# Patient Record
Sex: Female | Born: 1945 | Race: White | Hispanic: No | Marital: Married | Smoking: Never smoker
Health system: Southern US, Community
[De-identification: ages and names within clinical notes are randomized; demographics above are authoritative.]

## PROBLEM LIST (undated history)

## (undated) DIAGNOSIS — F329 Major depressive disorder, single episode, unspecified: Secondary | ICD-10-CM

## (undated) DIAGNOSIS — E7800 Pure hypercholesterolemia, unspecified: Secondary | ICD-10-CM

## (undated) DIAGNOSIS — J45909 Unspecified asthma, uncomplicated: Secondary | ICD-10-CM

## (undated) DIAGNOSIS — I10 Essential (primary) hypertension: Secondary | ICD-10-CM

## (undated) DIAGNOSIS — F32A Depression, unspecified: Secondary | ICD-10-CM

## (undated) DIAGNOSIS — K219 Gastro-esophageal reflux disease without esophagitis: Secondary | ICD-10-CM

## (undated) DIAGNOSIS — F419 Anxiety disorder, unspecified: Secondary | ICD-10-CM

## (undated) DIAGNOSIS — F429 Obsessive-compulsive disorder, unspecified: Secondary | ICD-10-CM

## (undated) HISTORY — PX: TONSILLECTOMY: SUR1361

## (undated) HISTORY — PX: APPENDECTOMY: SHX54

## (undated) HISTORY — PX: CHOLECYSTECTOMY: SHX55

---

## 2011-03-12 ENCOUNTER — Emergency Department (INDEPENDENT_AMBULATORY_CARE_PROVIDER_SITE_OTHER): Payer: 59

## 2011-03-12 ENCOUNTER — Emergency Department (HOSPITAL_BASED_OUTPATIENT_CLINIC_OR_DEPARTMENT_OTHER)
Admission: EM | Admit: 2011-03-12 | Discharge: 2011-03-12 | Disposition: A | Discharge status: Home / Self Care | Payer: 59 | Attending: Emergency Medicine | Admitting: Emergency Medicine

## 2011-03-12 ENCOUNTER — Encounter: Payer: Self-pay | Admitting: Student

## 2011-03-12 DIAGNOSIS — R110 Nausea: Secondary | ICD-10-CM | POA: Insufficient documentation

## 2011-03-12 DIAGNOSIS — E7800 Pure hypercholesterolemia, unspecified: Secondary | ICD-10-CM | POA: Insufficient documentation

## 2011-03-12 DIAGNOSIS — F411 Generalized anxiety disorder: Secondary | ICD-10-CM | POA: Insufficient documentation

## 2011-03-12 DIAGNOSIS — F3289 Other specified depressive episodes: Secondary | ICD-10-CM | POA: Insufficient documentation

## 2011-03-12 DIAGNOSIS — R141 Gas pain: Secondary | ICD-10-CM

## 2011-03-12 DIAGNOSIS — R1084 Generalized abdominal pain: Secondary | ICD-10-CM

## 2011-03-12 DIAGNOSIS — F329 Major depressive disorder, single episode, unspecified: Secondary | ICD-10-CM | POA: Insufficient documentation

## 2011-03-12 DIAGNOSIS — K219 Gastro-esophageal reflux disease without esophagitis: Secondary | ICD-10-CM | POA: Insufficient documentation

## 2011-03-12 DIAGNOSIS — I10 Essential (primary) hypertension: Secondary | ICD-10-CM | POA: Insufficient documentation

## 2011-03-12 DIAGNOSIS — R109 Unspecified abdominal pain: Secondary | ICD-10-CM

## 2011-03-12 HISTORY — DX: Anxiety disorder, unspecified: F41.9

## 2011-03-12 HISTORY — DX: Pure hypercholesterolemia, unspecified: E78.00

## 2011-03-12 HISTORY — DX: Essential (primary) hypertension: I10

## 2011-03-12 HISTORY — DX: Depression, unspecified: F32.A

## 2011-03-12 HISTORY — DX: Major depressive disorder, single episode, unspecified: F32.9

## 2011-03-12 HISTORY — DX: Gastro-esophageal reflux disease without esophagitis: K21.9

## 2011-03-12 HISTORY — DX: Obsessive-compulsive disorder, unspecified: F42.9

## 2011-03-12 LAB — COMPREHENSIVE METABOLIC PANEL
ALT: 20 U/L (ref 0–35)
AST: 22 U/L (ref 0–37)
CO2: 26 mEq/L (ref 19–32)
Chloride: 100 mEq/L (ref 96–112)
Creatinine, Ser: 0.7 mg/dL (ref 0.50–1.10)
GFR calc non Af Amer: >60 mL/min (ref >60)
Total Bilirubin: 0.8 mg/dL (ref 0.3–1.2)

## 2011-03-12 LAB — DIFFERENTIAL
Basophils Absolute: 0 10*3/uL (ref 0.0–0.1)
Lymphocytes Relative: 22 % (ref 12–46)
Monocytes Absolute: 0.6 10*3/uL (ref 0.1–1.0)
Neutro Abs: 4.9 10*3/uL (ref 1.7–7.7)

## 2011-03-12 LAB — URINALYSIS, ROUTINE W REFLEX MICROSCOPIC
Bilirubin Urine: NEGATIVE
Glucose, UA: NEGATIVE mg/dL
Hgb urine dipstick: NEGATIVE
Ketones, ur: NEGATIVE mg/dL
Protein, ur: NEGATIVE mg/dL

## 2011-03-12 LAB — CBC
HCT: 38.9 % (ref 36.0–46.0)
Hemoglobin: 13.2 g/dL (ref 12.0–15.0)
RBC: 4.55 MIL/uL (ref 3.87–5.11)
RDW: 12.6 % (ref 11.5–15.5)
WBC: 7.2 10*3/uL (ref 4.0–10.5)

## 2011-03-12 MED ORDER — SUCRALFATE 1 G PO TABS: 1.0000 g | ORAL_TABLET | Freq: Four times a day (QID) | ORAL | Status: DC

## 2011-03-12 MED ORDER — ONDANSETRON HCL 4 MG/2ML IJ SOLN
4.0000 mg | Freq: Once | INTRAMUSCULAR | Status: AC
  Administered 2011-03-12: 4 mg via INTRAVENOUS
  Filled 2011-03-12: qty 2

## 2011-03-12 MED ORDER — SODIUM CHLORIDE 0.9 % IV BOLUS (SEPSIS)
1000.0000 mL | Freq: Once | INTRAVENOUS | Status: AC
  Administered 2011-03-12: 1000 mL via INTRAVENOUS

## 2011-03-12 MED ORDER — LANSOPRAZOLE 30 MG PO TBDP: 30.0000 mg | ORAL_TABLET | Freq: Every day | ORAL | Status: AC

## 2011-03-12 MED ORDER — LORAZEPAM 2 MG/ML IJ SOLN
1.0000 mg | Freq: Once | INTRAMUSCULAR | Status: AC
  Administered 2011-03-12: 1 mg via INTRAVENOUS
  Filled 2011-03-12: qty 1

## 2011-03-12 MED ORDER — LORAZEPAM 1 MG PO TABS: 1.0000 mg | ORAL_TABLET | Freq: Three times a day (TID) | ORAL | Status: AC | PRN

## 2011-03-12 MED ORDER — POTASSIUM CHLORIDE CRYS ER 20 MEQ PO TBCR
30.0000 meq | EXTENDED_RELEASE_TABLET | Freq: Once | ORAL | Status: AC
  Administered 2011-03-12: 30 meq via ORAL
  Filled 2011-03-12 (×2): qty 1

## 2011-03-12 NOTE — ED Provider Notes (Addendum)
History     Chief Complaint  Patient presents with  . Abdominal Pain   HPI Comments: Pt also wakes up in the morning with nausea.    Patient is a 65 y.o. female presenting with anxiety and cramps. The history is provided by the patient.  Anxiety This is a chronic (Pt does have a history of OCD and anxiety.) problem. Episode onset: 4 months ago. The problem occurs daily. The problem has been gradually worsening. Associated symptoms include abdominal pain. Pertinent negatives include no chest pain and no headaches. The symptoms are aggravated by nothing. The symptoms are relieved by nothing. Treatments tried: Pt has seen her psychiatrist and was put on 50 mg  zoloft.  She has not seen them recently even though her symptoms have been escalating. The patient feels like she is breaking down. However she is not suicidal or homicidal.  Abdominal Cramping The primary symptoms of the illness include abdominal pain, nausea and diarrhea. The primary symptoms of the illness do not include fever, vomiting, dysuria or vaginal discharge. Primary symptoms comment: PT thinks her anxiety could be playing a factorr although the pain is newer over the last few days.  Her appetiite and nausea issue was back in may but it resolved until this episode.   The pain came on gradually. Progression: worse in the am , better at night. The abdominal pain is generalized. The abdominal pain does not radiate. Exacerbated by: not increased with eating.  Symptoms associated with the illness do not include chills, constipation, urgency, frequency or back pain.   per the nursing notes,Pt in with c/o severe nausea starting Monday, denies V. Reports MD increased reglan script. Reports decreased po intake, reports pain in center of stomach and RLQ. Pt reports onset of anxiety each morning. Pt also reports PCP tested stool for ulcer and she was negative. Reports diarrhea starting 2 days ago   Past Medical History  Diagnosis Date  .  Obsessive compulsive disorder   . Anxiety   . Hypertension   . Depression   . Hypercholesterolemia   . GERD (gastroesophageal reflux disease)     Past Surgical History  Procedure Date  . Appendectomy   . Tonsillectomy   . Cholecystectomy     History reviewed. No pertinent family history.  History  Substance Use Topics  . Smoking status: Never Smoker   . Smokeless tobacco: Never Used  . Alcohol Use: No    OB History    Grav Para Term Preterm Abortions TAB SAB Ect Mult Living                  Review of Systems  Constitutional: Negative for fever and chills.  Cardiovascular: Negative for chest pain.  Gastrointestinal: Positive for nausea, abdominal pain and diarrhea. Negative for vomiting and constipation.  Genitourinary: Negative for dysuria, urgency, frequency and vaginal discharge.  Musculoskeletal: Negative for back pain.  Neurological: Negative for headaches.  All other systems reviewed and are negative.    Physical Exam  BP 135/94  Pulse 96  Temp(Src) 98.2 F (36.8 C) (Oral)  Resp 20  Wt 180 lb (81.647 kg)  SpO2 97%  Physical Exam  Constitutional: She appears well-developed and well-nourished. No distress.  HENT:  Head: Normocephalic and atraumatic.  Right Ear: External ear normal.  Left Ear: External ear normal.  Eyes: Conjunctivae are normal. Right eye exhibits no discharge. Left eye exhibits no discharge. No scleral icterus.  Neck: Neck supple. No tracheal deviation present.  Cardiovascular: Normal  rate, regular rhythm and intact distal pulses.   Pulmonary/Chest: Effort normal and breath sounds normal. No stridor. No respiratory distress. She has no wheezes. She has no rales.  Abdominal: Soft. Bowel sounds are normal. She exhibits no distension. There is tenderness (mild diffuse, no specific focal area). There is no rebound and no guarding.  Musculoskeletal: She exhibits no edema and no tenderness.  Neurological: She is alert. She has normal  strength. No sensory deficit. Cranial nerve deficit:  no gross defecits noted. She exhibits normal muscle tone. She displays no seizure activity. Coordination normal.  Skin: Skin is warm and dry. No rash noted.  Psychiatric: Her speech is normal. Thought content is not paranoid and not delusional.       Anxious     ED Course  Procedures Labs were all normal except as noted below.  Nursing notes, lab results, and x-ray interpretations were reviewed and considered as part of the evaluation and were factored into the medical decision making. Labs Reviewed  COMPREHENSIVE METABOLIC PANEL - Abnormal; Notable for the following:    Potassium 3.2 (*)    Glucose, Bld 122 (*)    All other components within normal limits  URINALYSIS, ROUTINE W REFLEX MICROSCOPIC - Abnormal; Notable for the following:    Leukocytes, UA SMALL (*)    All other components within normal limits  CBC  DIFFERENTIAL  LIPASE, BLOOD  URINE MICROSCOPIC-ADD ON  Hypokalemia is mild.  Will oral dose of medications here. IVs was inserted. Patient was given IV fluids anti-emetics and Ativan.  Acute abdominal series without any significant findings per the radiologist report.  MDM Patient does not have any discrete areas of tenderness. I am not suspicious at this time of bowel obstruction, diverticulitis or any other acute emergency medical condition at this time. I believe is a good chance that her symptoms may be related to her anxiety. Gastritis may be a possibility as well. Other etiologies such as irritable bowel syndrome are also a possibility.  Patient does have a psychiatrist that she is seen in the past and have encouraged her to followup with her psychiatrist to treat anxiety further. She had good response today to a dose of Ativan. I will give her a short course of Ativan to take temporarily although this would not be a good long-term treatment for her anxiety.   I doubt any other EMC precluding discharge at this  time.  Celene Kras, MD 03/12/11 1328  Celene Kras, MD 04/10/11 [phone]

## 2011-03-12 NOTE — ED Notes (Addendum)
Pt reports very little relief when eating. Pt has also decreased po intake of zoloft and gerd rx. Reports of recent life events possibly triggering anxiety - reports retiring 4 months ago.

## 2011-03-12 NOTE — ED Notes (Signed)
Pt in with c/o severe nausea starting Monday, denies V. Reports MD increased reglan script. Reports decreased po intake, reports pain in center of stomach and RLQ. Pt reports onset of anxiety each morning. Pt also reports PCP tested stool for ulcer and she was negative. Reports diarrhea starting 2 days ago. Pt is tearful and anxious in triage and reports feeling like she is falling apart. Denies SI or HI.

## 2013-01-21 DIAGNOSIS — F339 Major depressive disorder, recurrent, unspecified: Secondary | ICD-10-CM | POA: Insufficient documentation

## 2013-12-11 DIAGNOSIS — F429 Obsessive-compulsive disorder, unspecified: Secondary | ICD-10-CM | POA: Insufficient documentation

## 2014-03-09 DIAGNOSIS — IMO0002 Reserved for concepts with insufficient information to code with codable children: Secondary | ICD-10-CM | POA: Insufficient documentation

## 2014-03-09 DIAGNOSIS — M1710 Unilateral primary osteoarthritis, unspecified knee: Secondary | ICD-10-CM | POA: Insufficient documentation

## 2015-10-24 DIAGNOSIS — I498 Other specified cardiac arrhythmias: Secondary | ICD-10-CM | POA: Insufficient documentation

## 2015-10-24 DIAGNOSIS — G4733 Obstructive sleep apnea (adult) (pediatric): Secondary | ICD-10-CM | POA: Insufficient documentation

## 2015-10-29 DIAGNOSIS — R109 Unspecified abdominal pain: Secondary | ICD-10-CM | POA: Insufficient documentation

## 2015-10-29 DIAGNOSIS — R300 Dysuria: Secondary | ICD-10-CM | POA: Insufficient documentation

## 2015-10-29 DIAGNOSIS — R112 Nausea with vomiting, unspecified: Secondary | ICD-10-CM | POA: Insufficient documentation

## 2015-11-04 DIAGNOSIS — N6320 Unspecified lump in the left breast, unspecified quadrant: Secondary | ICD-10-CM | POA: Insufficient documentation

## 2017-06-02 DIAGNOSIS — M1712 Unilateral primary osteoarthritis, left knee: Secondary | ICD-10-CM | POA: Insufficient documentation

## 2017-06-03 DIAGNOSIS — K625 Hemorrhage of anus and rectum: Secondary | ICD-10-CM | POA: Insufficient documentation

## 2017-08-16 DIAGNOSIS — H903 Sensorineural hearing loss, bilateral: Secondary | ICD-10-CM | POA: Insufficient documentation

## 2017-09-13 DIAGNOSIS — N814 Uterovaginal prolapse, unspecified: Secondary | ICD-10-CM | POA: Insufficient documentation

## 2017-11-30 DIAGNOSIS — I495 Sick sinus syndrome: Secondary | ICD-10-CM | POA: Insufficient documentation

## 2017-12-20 DIAGNOSIS — Z45018 Encounter for adjustment and management of other part of cardiac pacemaker: Secondary | ICD-10-CM | POA: Insufficient documentation

## 2018-07-01 DIAGNOSIS — E785 Hyperlipidemia, unspecified: Secondary | ICD-10-CM | POA: Insufficient documentation

## 2019-02-15 ENCOUNTER — Encounter (HOSPITAL_BASED_OUTPATIENT_CLINIC_OR_DEPARTMENT_OTHER): Payer: Self-pay

## 2019-02-15 ENCOUNTER — Emergency Department (HOSPITAL_BASED_OUTPATIENT_CLINIC_OR_DEPARTMENT_OTHER): Payer: Medicare HMO

## 2019-02-15 ENCOUNTER — Inpatient Hospital Stay (HOSPITAL_BASED_OUTPATIENT_CLINIC_OR_DEPARTMENT_OTHER)
Admission: EM | Admit: 2019-02-15 | Discharge: 2019-02-17 | DRG: 074 | Disposition: A | Discharge status: Home Health | Payer: Medicare HMO | Attending: Internal Medicine | Admitting: Internal Medicine

## 2019-02-15 ENCOUNTER — Other Ambulatory Visit: Payer: Self-pay

## 2019-02-15 DIAGNOSIS — Z888 Allergy status to other drugs, medicaments and biological substances status: Secondary | ICD-10-CM

## 2019-02-15 DIAGNOSIS — W19XXXA Unspecified fall, initial encounter: Secondary | ICD-10-CM

## 2019-02-15 DIAGNOSIS — S42211A Unspecified displaced fracture of surgical neck of right humerus, initial encounter for closed fracture: Secondary | ICD-10-CM | POA: Diagnosis present

## 2019-02-15 DIAGNOSIS — F329 Major depressive disorder, single episode, unspecified: Secondary | ICD-10-CM | POA: Diagnosis present

## 2019-02-15 DIAGNOSIS — F429 Obsessive-compulsive disorder, unspecified: Secondary | ICD-10-CM | POA: Diagnosis present

## 2019-02-15 DIAGNOSIS — I480 Paroxysmal atrial fibrillation: Secondary | ICD-10-CM | POA: Diagnosis present

## 2019-02-15 DIAGNOSIS — Z5309 Procedure and treatment not carried out because of other contraindication: Secondary | ICD-10-CM | POA: Diagnosis not present

## 2019-02-15 DIAGNOSIS — Z881 Allergy status to other antibiotic agents status: Secondary | ICD-10-CM

## 2019-02-15 DIAGNOSIS — I495 Sick sinus syndrome: Secondary | ICD-10-CM | POA: Diagnosis present

## 2019-02-15 DIAGNOSIS — K219 Gastro-esophageal reflux disease without esophagitis: Secondary | ICD-10-CM | POA: Diagnosis present

## 2019-02-15 DIAGNOSIS — G4733 Obstructive sleep apnea (adult) (pediatric): Secondary | ICD-10-CM | POA: Diagnosis present

## 2019-02-15 DIAGNOSIS — G629 Polyneuropathy, unspecified: Principal | ICD-10-CM | POA: Diagnosis present

## 2019-02-15 DIAGNOSIS — Z950 Presence of cardiac pacemaker: Secondary | ICD-10-CM | POA: Diagnosis present

## 2019-02-15 DIAGNOSIS — Z20828 Contact with and (suspected) exposure to other viral communicable diseases: Secondary | ICD-10-CM | POA: Diagnosis present

## 2019-02-15 DIAGNOSIS — Z9049 Acquired absence of other specified parts of digestive tract: Secondary | ICD-10-CM

## 2019-02-15 DIAGNOSIS — R55 Syncope and collapse: Secondary | ICD-10-CM

## 2019-02-15 DIAGNOSIS — Z79899 Other long term (current) drug therapy: Secondary | ICD-10-CM

## 2019-02-15 DIAGNOSIS — J45909 Unspecified asthma, uncomplicated: Secondary | ICD-10-CM | POA: Diagnosis present

## 2019-02-15 DIAGNOSIS — I10 Essential (primary) hypertension: Secondary | ICD-10-CM | POA: Diagnosis present

## 2019-02-15 DIAGNOSIS — R52 Pain, unspecified: Secondary | ICD-10-CM

## 2019-02-15 DIAGNOSIS — S0081XA Abrasion of other part of head, initial encounter: Secondary | ICD-10-CM | POA: Diagnosis present

## 2019-02-15 DIAGNOSIS — E7800 Pure hypercholesterolemia, unspecified: Secondary | ICD-10-CM | POA: Diagnosis present

## 2019-02-15 DIAGNOSIS — I351 Nonrheumatic aortic (valve) insufficiency: Secondary | ICD-10-CM | POA: Diagnosis not present

## 2019-02-15 DIAGNOSIS — S42201A Unspecified fracture of upper end of right humerus, initial encounter for closed fracture: Secondary | ICD-10-CM

## 2019-02-15 DIAGNOSIS — R42 Dizziness and giddiness: Secondary | ICD-10-CM | POA: Diagnosis present

## 2019-02-15 DIAGNOSIS — S022XXA Fracture of nasal bones, initial encounter for closed fracture: Secondary | ICD-10-CM | POA: Diagnosis not present

## 2019-02-15 DIAGNOSIS — R296 Repeated falls: Secondary | ICD-10-CM | POA: Diagnosis present

## 2019-02-15 DIAGNOSIS — I361 Nonrheumatic tricuspid (valve) insufficiency: Secondary | ICD-10-CM | POA: Diagnosis not present

## 2019-02-15 DIAGNOSIS — F419 Anxiety disorder, unspecified: Secondary | ICD-10-CM | POA: Diagnosis present

## 2019-02-15 HISTORY — DX: Unspecified asthma, uncomplicated: J45.909

## 2019-02-15 LAB — COMPREHENSIVE METABOLIC PANEL
ALT: 41 U/L (ref 0–44)
AST: 42 U/L — ABNORMAL HIGH (ref 15–41)
Albumin: 3.6 g/dL (ref 3.5–5.0)
Alkaline Phosphatase: 63 U/L (ref 38–126)
Anion gap: 12 (ref 5–15)
BUN: 10 mg/dL (ref 8–23)
CO2: 22 mmol/L (ref 22–32)
Calcium: 8.4 mg/dL — ABNORMAL LOW (ref 8.9–10.3)
Chloride: 103 mmol/L (ref 98–111)
Creatinine, Ser: 0.92 mg/dL (ref 0.44–1.00)
GFR calc Af Amer: >60 mL/min (ref >60)
GFR calc non Af Amer: >60 mL/min (ref >60)
Glucose, Bld: 129 mg/dL — ABNORMAL HIGH (ref 70–99)
Potassium: 3.5 mmol/L (ref 3.5–5.1)
Sodium: 137 mmol/L (ref 135–145)
Total Bilirubin: 0.9 mg/dL (ref 0.3–1.2)
Total Protein: 6.7 g/dL (ref 6.5–8.1)

## 2019-02-15 LAB — CBC
HCT: 36.2 % (ref 36.0–46.0)
Hemoglobin: 12 g/dL (ref 12.0–15.0)
MCH: 30.2 pg (ref 26.0–34.0)
MCHC: 33.1 g/dL (ref 30.0–36.0)
MCV: 91 fL (ref 80.0–100.0)
Platelets: 232 10*3/uL (ref 150–400)
RBC: 3.98 MIL/uL (ref 3.87–5.11)
RDW: 12.3 % (ref 11.5–15.5)
WBC: 7.7 10*3/uL (ref 4.0–10.5)
nRBC: 0 % (ref 0.0–0.2)

## 2019-02-15 LAB — CK: Total CK: 43 U/L (ref 38–234)

## 2019-02-15 LAB — TROPONIN I (HIGH SENSITIVITY)
Troponin I (High Sensitivity): <2 ng/L (ref <18)
Troponin I (High Sensitivity): <2 ng/L (ref <18)

## 2019-02-15 LAB — SARS CORONAVIRUS 2 AG (30 MIN TAT): SARS Coronavirus 2 Ag: NEGATIVE

## 2019-02-15 MED ORDER — ONDANSETRON HCL 4 MG/2ML IJ SOLN: 4.0000 mg | Freq: Four times a day (QID) | INTRAMUSCULAR | Status: DC | PRN

## 2019-02-15 MED ORDER — HYDROMORPHONE HCL 1 MG/ML IJ SOLN
1.0000 mg | Freq: Once | INTRAMUSCULAR | Status: AC
  Administered 2019-02-15: 1 mg via INTRAVENOUS
  Filled 2019-02-15: qty 1

## 2019-02-15 MED ORDER — ACETAMINOPHEN 650 MG RE SUPP: 650.0000 mg | Freq: Four times a day (QID) | RECTAL | Status: DC | PRN

## 2019-02-15 MED ORDER — ONDANSETRON HCL 4 MG PO TABS
4.0000 mg | ORAL_TABLET | Freq: Four times a day (QID) | ORAL | Status: DC | PRN
  Administered 2019-02-16: 4 mg via ORAL
  Filled 2019-02-15: qty 1

## 2019-02-15 MED ORDER — HYDROMORPHONE HCL 1 MG/ML IJ SOLN
0.5000 mg | Freq: Once | INTRAMUSCULAR | Status: AC
  Administered 2019-02-15: 0.5 mg via INTRAVENOUS
  Filled 2019-02-15: qty 1

## 2019-02-15 MED ORDER — SODIUM CHLORIDE 0.9 % IV BOLUS
500.0000 mL | Freq: Once | INTRAVENOUS | Status: AC
  Administered 2019-02-15: 500 mL via INTRAVENOUS

## 2019-02-15 MED ORDER — BUSPIRONE HCL 5 MG PO TABS
15.0000 mg | ORAL_TABLET | Freq: Three times a day (TID) | ORAL | Status: DC
  Administered 2019-02-15 – 2019-02-17 (×5): 15 mg via ORAL
  Filled 2019-02-15 (×5): qty 3

## 2019-02-15 MED ORDER — ACETAMINOPHEN 325 MG PO TABS
650.0000 mg | ORAL_TABLET | Freq: Four times a day (QID) | ORAL | Status: DC | PRN
  Administered 2019-02-16 – 2019-02-17 (×5): 650 mg via ORAL
  Filled 2019-02-15 (×5): qty 2

## 2019-02-15 MED ORDER — ALPRAZOLAM 0.5 MG PO TABS
0.5000 mg | ORAL_TABLET | Freq: Three times a day (TID) | ORAL | Status: DC
  Administered 2019-02-15 – 2019-02-17 (×5): 0.5 mg via ORAL
  Filled 2019-02-15 (×5): qty 1

## 2019-02-15 MED ORDER — METOPROLOL SUCCINATE ER 50 MG PO TB24
50.0000 mg | ORAL_TABLET | Freq: Every day | ORAL | Status: DC
  Administered 2019-02-16 – 2019-02-17 (×2): 50 mg via ORAL
  Filled 2019-02-15 (×2): qty 1

## 2019-02-15 MED ORDER — MORPHINE SULFATE (PF) 2 MG/ML IV SOLN
0.5000 mg | Freq: Once | INTRAVENOUS | Status: AC
  Administered 2019-02-15: 0.5 mg via INTRAVENOUS
  Filled 2019-02-15: qty 1

## 2019-02-15 MED ORDER — ENOXAPARIN SODIUM 40 MG/0.4ML ~~LOC~~ SOLN: 40.0000 mg | SUBCUTANEOUS | Status: DC

## 2019-02-15 MED ORDER — ROSUVASTATIN CALCIUM 10 MG PO TABS
10.0000 mg | ORAL_TABLET | Freq: Every day | ORAL | Status: DC
  Administered 2019-02-15 – 2019-02-17 (×3): 10 mg via ORAL
  Filled 2019-02-15 (×3): qty 1

## 2019-02-15 NOTE — H&P (Signed)
History and Physical    Kayla Beard YNW:[id number] DOB: 01/12/1946 DOA: 02/15/2019  PCP: Forrest Moron, MD  Patient coming from: Home.  Chief Complaint: Loss of consciousness.  HPI: Kayla Beard is a 73 y.o. 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  Patient states over the last few months patient has been having falls due to her legs giving way.  Had followed up with neurologist who feels her symptoms are due to neuropathy.  Today when patient was walking on the driveway to the road her legs gave way and fell onto the floor and hit her face.  Lost consciousness.  Prior to the fall patient did feel dizziness.  Denies any chest pain or shortness of breath.  No nausea vomiting or diarrhea.  ED Course: In the ER patient had CT head maxillofacial and C-spine which shows nasal bone fracture with small hematoma.  Patient also hurt her right shoulder which shows right humerus fracture.  ER physician discussed with on-call orthopedic surgeon Dr. Linna Caprice who advised sling for the right humerus fracture and follow-up as outpatient.  EKG shows paced rhythm.  Patient admitted for further observation.  Review of Systems: As per HPI, rest all negative.   Past Medical History:  Diagnosis Date   Anxiety    Asthma    Depression    GERD (gastroesophageal reflux disease)    Hypercholesterolemia    Hypertension    Obsessive compulsive disorder     Past Surgical History:  Procedure Laterality Date   APPENDECTOMY     CHOLECYSTECTOMY     TONSILLECTOMY       reports that she has never smoked. She has never used smokeless tobacco. She reports that she does not drink alcohol or use drugs.  Allergies  Allergen Reactions   Hydroxyzine     Other reaction(s): Other (See Comments) Sleepiness Nightmares    Prednisone Anxiety    Other reaction(s): Other (See  Comments)   Rivaroxaban Other (See Comments)    Bleeding Bleeding    Doxepin    Levofloxacin Diarrhea   Metformin And Related Diarrhea    Family History  Family history unknown: Yes    Prior to Admission medications   Medication Sig Start Date End Date Taking? Authorizing Provider  ALPRAZolam Prudy Feeler) 1 MG tablet Take 0.5 mg by mouth 3 (three) times daily. 01/29/19  Yes [provider]  busPIRone (BUSPAR) 15 MG tablet Take 15 mg by mouth 3 (three) times daily. 12/02/18  Yes [provider]  metoprolol (TOPROL-XL) 50 MG 24 hr tablet Take 50 mg by mouth daily.     Yes [provider]  rosuvastatin (CRESTOR) 10 MG tablet Take 10 mg by mouth daily. 11/19/18  Yes [provider]  lansoprazole (PREVACID SOLUTAB) 30 MG disintegrating tablet Take 1 tablet (30 mg total) by mouth daily. 03/12/11 03/11/12  Linwood Dibbles, MD  sucralfate (CARAFATE) 1 G tablet Take 1 tablet (1 g total) by mouth 4 (four) times daily. 03/12/11 03/11/12  Linwood Dibbles, MD    Physical Exam:  Constitutional: Moderately built and nourished. Vitals:   02/15/19 1600 02/15/19 1624 02/15/19 1630 02/15/19 1738  BP: 107/63 (!) 101/52 105/60 102/82  Pulse: 65 72 74 77  Resp: Temp:    98.6 F (37 C)  TempSrc:    Oral  SpO2: 93% 95% 92% 99%  Weight:  Height:       Eyes: Anicteric no pallor. ENMT: Small hematoma on the nasal bridge. Neck: No no JVD no mass felt. Respiratory: No rhonchi or crepitations. Cardiovascular: S1-S2 heard. Abdomen: Soft nontender bowel sounds present. Musculoskeletal: Pain on the right upper extremity. Skin: No rash. Neurologic: Alert awake oriented to time place and person.  Moves all extremities. Psychiatric: Appears normal.   Labs on Admission: I have personally reviewed following labs and imaging studies  CBC: Recent Labs  Lab 02/15/19 1251  WBC 7.7  HGB 12.0  HCT 36.2  MCV 91.0  PLT 232   Basic Metabolic Panel: Recent Labs  Lab  02/15/19 1251  NA 137  K 3.5  CL 103  CO2 22  GLUCOSE 129*  BUN 10  CREATININE 0.92  CALCIUM 8.4*   GFR: Estimated Creatinine Clearance: 50.6 mL/min (by C-G formula based on SCr of 0.92 mg/dL). Liver Function Tests: Recent Labs  Lab 02/15/19 1251  AST 42*  ALT 41  ALKPHOS 63  BILITOT 0.9  PROT 6.7  ALBUMIN 3.6   No results for input(s): LIPASE, AMYLASE in the last 168 hours. No results for input(s): AMMONIA in the last 168 hours. Coagulation Profile: No results for input(s): INR, PROTIME in the last 168 hours. Cardiac Enzymes: No results for input(s): CKTOTAL, CKMB, CKMBINDEX, TROPONINI in the last 168 hours. BNP (last 3 results) No results for input(s): PROBNP in the last 8760 hours. HbA1C: No results for input(s): HGBA1C in the last 72 hours. CBG: No results for input(s): GLUCAP in the last 168 hours. Lipid Profile: No results for input(s): CHOL, HDL, LDLCALC, TRIG, CHOLHDL, LDLDIRECT in the last 72 hours. Thyroid Function Tests: No results for input(s): TSH, T4TOTAL, FREET4, T3FREE, THYROIDAB in the last 72 hours. Anemia Panel: No results for input(s): VITAMINB12, FOLATE, FERRITIN, TIBC, IRON, RETICCTPCT in the last 72 hours. Urine analysis:    Component Value Date/Time   COLORURINE YELLOW 03/12/2011 1146   APPEARANCEUR CLEAR 03/12/2011 1146   LABSPEC 1.010 03/12/2011 1146   PHURINE 5.5 03/12/2011 1146   GLUCOSEU NEGATIVE 03/12/2011 1146   HGBUR NEGATIVE 03/12/2011 1146   BILIRUBINUR NEGATIVE 03/12/2011 1146   KETONESUR NEGATIVE 03/12/2011 1146   PROTEINUR NEGATIVE 03/12/2011 1146   UROBILINOGEN 0.2 03/12/2011 1146   NITRITE NEGATIVE 03/12/2011 1146   LEUKOCYTESUR SMALL (A) 03/12/2011 1146   Sepsis Labs: @LABRCNTIP (procalcitonin:4,lacticidven:4) ) Recent Results (from the past 240 hour(s))  SARS Coronavirus 2 (Hosp order,Performed [facility] lab via [employer] ID)     Status: None   Collection Time: 02/15/19  1:37 PM   Specimen: Dry Nasal Swab  ([employer] ID Now)  Result Value Ref Range Status   SARS Coronavirus 2 ([employer] ID Now) NEGATIVE NEGATIVE Final    Comment: (NOTE) SARS-CoV-2 target nucleic acids are NOT DETECTED. The SARS-CoV-2 RNA is generally detectable in upper and lower respiratory specimens during the acute phase of infection.  Negativeresults do not preclude SARS-CoV-2 infection, do not rule out coinfections with other pathogens, and should not be used as the  sole basis for treatment or other patient management decisions.  Negative results must be combined with clinical observations, patient history, and epidemiological information. The expected result is Negative. Fact Sheet for Patients: http://www.graves-[place].org/https://www.fda.gov/media/136524/download Fact Sheet for Healthcare Providers: En[employer]roConcern.sihttps://www.fda.gov/media/136523/download This test is not yet approved or cleared by the [place] FDA and  has been authorized for detection and/or diagnosis of SARS-CoV-2 by FDA under an Emergency Use Authorization (EUA).  This EUA will remain in effect (meaning  this test can be used) for the duration of  the COVID19 declaration under Section 5 64(b)(1) of the Act, 21 U.S.C.  section [phone] 3(b)(1), unless the authorization is terminated or revoked sooner. Performed at [facility], [address] [place], [place] [address]      Radiological Exams on Admission: Dg Chest 1 View  Result Date: 02/15/2019 CLINICAL DATA:  Fall. EXAM: CHEST  1 VIEW COMPARISON:  Radiographs of June 13, 2018. FINDINGS: The heart size and mediastinal contours are within normal limits. No pneumothorax or pleural effusion is noted. Left-sided pacemaker is unchanged in position. Both lungs are clear. Mildly displaced proximal right humeral neck fracture is noted. IMPRESSION: No acute cardiopulmonary abnormality seen. Mildly displaced proximal right humeral neck fracture. Electronically Signed   By: Marijo Conception M.D.   On: 02/15/2019 13:55   Dg Shoulder  Right  Result Date: 02/15/2019 CLINICAL DATA:  Right shoulder pain after fall today. EXAM: RIGHT SHOULDER - 2+ VIEW COMPARISON:  None. FINDINGS: Mildly displaced proximal right humeral neck fracture is noted. Severe degenerative changes seen involving the right glenohumeral joint. Visualized ribs are unremarkable. IMPRESSION: Mildly displaced proximal right humeral neck fracture. Electronically Signed   By: Marijo Conception M.D.   On: 02/15/2019 13:56   Ct Head Wo Contrast  Result Date: 02/15/2019 CLINICAL DATA:  Status post fall today and landed on the face. Regions to the forehead, nose, lips EXAM: CT HEAD WITHOUT CONTRAST CT MAXILLOFACIAL WITHOUT CONTRAST CT CERVICAL SPINE WITHOUT CONTRAST TECHNIQUE: Multidetector CT imaging of the head, cervical spine, and maxillofacial structures were performed using the standard protocol without intravenous contrast. Multiplanar CT image reconstructions of the cervical spine and maxillofacial structures were also generated. COMPARISON:  None. FINDINGS: CT HEAD FINDINGS Brain: No evidence of acute infarction, hemorrhage, extra-axial collection, ventriculomegaly, or mass effect. Generalized cerebral atrophy. Periventricular white matter low attenuation likely secondary to microangiopathy. Vascular: Cerebrovascular atherosclerotic calcifications are noted. Skull: Negative for fracture or focal lesion. Sinuses/Orbits: Visualized portions of the orbits are unremarkable. Visualized portions of the paranasal sinuses and mastoid air cells are unremarkable. Other: None. CT MAXILLOFACIAL FINDINGS Osseous: No mandibular fracture or dislocation. Mildly comminuted right nasal bone fracture. Nondisplaced left nasal bone fracture. Overlying soft tissue swelling. Temporomandibular joints are normal. No aggressive osseous lesion. Orbits: Negative. No traumatic or inflammatory finding. Sinuses: Paranasal sinuses are clear. Mastoid sinuses are clear. Rightward deviation of the nasal septum.  Soft tissues: No fluid collection or hematoma. Soft tissue swelling over the nose. CT CERVICAL SPINE FINDINGS Alignment: Normal. Skull base and vertebrae: No acute fracture. No primary bone lesion or focal pathologic process. Soft tissues and spinal canal: No prevertebral fluid or swelling. No visible canal hematoma. Disc levels: Degenerative disease with disc height loss at C6-7 and C7-T1. Right uncovertebral degenerative changes at C6-7 and C7-T1 with foraminal stenosis. Bilateral facet arthropathy at C7-T1. Upper chest: Lung apices are clear. Other: No fluid collection or hematoma. IMPRESSION: 1. No acute intracranial pathology. 2. Mildly comminuted right nasal bone fracture. Nondisplaced left nasal bone fracture. Overlying soft tissue swelling. 3.  No acute osseous injury of the cervical spine. Electronically Signed   By: Kathreen Devoid   On: 02/15/2019 13:44   Ct Cervical Spine Wo Contrast  Result Date: 02/15/2019 CLINICAL DATA:  Status post fall today and landed on the face. Regions to the forehead, nose, lips EXAM: CT HEAD WITHOUT CONTRAST CT MAXILLOFACIAL WITHOUT CONTRAST CT CERVICAL SPINE WITHOUT CONTRAST TECHNIQUE: Multidetector CT imaging of  the head, cervical spine, and maxillofacial structures were performed using the standard protocol without intravenous contrast. Multiplanar CT image reconstructions of the cervical spine and maxillofacial structures were also generated. COMPARISON:  None. FINDINGS: CT HEAD FINDINGS Brain: No evidence of acute infarction, hemorrhage, extra-axial collection, ventriculomegaly, or mass effect. Generalized cerebral atrophy. Periventricular white matter low attenuation likely secondary to microangiopathy. Vascular: Cerebrovascular atherosclerotic calcifications are noted. Skull: Negative for fracture or focal lesion. Sinuses/Orbits: Visualized portions of the orbits are unremarkable. Visualized portions of the paranasal sinuses and mastoid air cells are unremarkable.  Other: None. CT MAXILLOFACIAL FINDINGS Osseous: No mandibular fracture or dislocation. Mildly comminuted right nasal bone fracture. Nondisplaced left nasal bone fracture. Overlying soft tissue swelling. Temporomandibular joints are normal. No aggressive osseous lesion. Orbits: Negative. No traumatic or inflammatory finding. Sinuses: Paranasal sinuses are clear. Mastoid sinuses are clear. Rightward deviation of the nasal septum. Soft tissues: No fluid collection or hematoma. Soft tissue swelling over the nose. CT CERVICAL SPINE FINDINGS Alignment: Normal. Skull base and vertebrae: No acute fracture. No primary bone lesion or focal pathologic process. Soft tissues and spinal canal: No prevertebral fluid or swelling. No visible canal hematoma. Disc levels: Degenerative disease with disc height loss at C6-7 and C7-T1. Right uncovertebral degenerative changes at C6-7 and C7-T1 with foraminal stenosis. Bilateral facet arthropathy at C7-T1. Upper chest: Lung apices are clear. Other: No fluid collection or hematoma. IMPRESSION: 1. No acute intracranial pathology. 2. Mildly comminuted right nasal bone fracture. Nondisplaced left nasal bone fracture. Overlying soft tissue swelling. 3.  No acute osseous injury of the cervical spine. Electronically Signed   By: Elige KoHetal  Patel   On: 02/15/2019 13:44   Ct Maxillofacial Wo Contrast  Result Date: 02/15/2019 CLINICAL DATA:  Status post fall today and landed on the face. Regions to the forehead, nose, lips EXAM: CT HEAD WITHOUT CONTRAST CT MAXILLOFACIAL WITHOUT CONTRAST CT CERVICAL SPINE WITHOUT CONTRAST TECHNIQUE: Multidetector CT imaging of the head, cervical spine, and maxillofacial structures were performed using the standard protocol without intravenous contrast. Multiplanar CT image reconstructions of the cervical spine and maxillofacial structures were also generated. COMPARISON:  None. FINDINGS: CT HEAD FINDINGS Brain: No evidence of acute infarction, hemorrhage, extra-axial  collection, ventriculomegaly, or mass effect. Generalized cerebral atrophy. Periventricular white matter low attenuation likely secondary to microangiopathy. Vascular: Cerebrovascular atherosclerotic calcifications are noted. Skull: Negative for fracture or focal lesion. Sinuses/Orbits: Visualized portions of the orbits are unremarkable. Visualized portions of the paranasal sinuses and mastoid air cells are unremarkable. Other: None. CT MAXILLOFACIAL FINDINGS Osseous: No mandibular fracture or dislocation. Mildly comminuted right nasal bone fracture. Nondisplaced left nasal bone fracture. Overlying soft tissue swelling. Temporomandibular joints are normal. No aggressive osseous lesion. Orbits: Negative. No traumatic or inflammatory finding. Sinuses: Paranasal sinuses are clear. Mastoid sinuses are clear. Rightward deviation of the nasal septum. Soft tissues: No fluid collection or hematoma. Soft tissue swelling over the nose. CT CERVICAL SPINE FINDINGS Alignment: Normal. Skull base and vertebrae: No acute fracture. No primary bone lesion or focal pathologic process. Soft tissues and spinal canal: No prevertebral fluid or swelling. No visible canal hematoma. Disc levels: Degenerative disease with disc height loss at C6-7 and C7-T1. Right uncovertebral degenerative changes at C6-7 and C7-T1 with foraminal stenosis. Bilateral facet arthropathy at C7-T1. Upper chest: Lung apices are clear. Other: No fluid collection or hematoma. IMPRESSION: 1. No acute intracranial pathology. 2. Mildly comminuted right nasal bone fracture. Nondisplaced left nasal bone fracture. Overlying soft tissue swelling. 3.  No acute osseous injury  of the cervical spine. Electronically Signed   By: Elige KoHetal  Patel   On: 02/15/2019 13:44    EKG: Independently reviewed.  Paced rhythm.  Assessment/Plan Principal Problem:   Syncope Active Problems:   Fall   Closed fracture of right proximal humerus   PAF (paroxysmal atrial fibrillation) (HCC)    Pacemaker   Closed fracture of nasal bones    1. Syncope -patient had mild dizziness prior to the fall.  Has been having recurrent falls previously and neurologist attributes to neuropathy as per the patient.  Given the pacemaker with a history of tachybradycardia syndrome I have consulted cardiology.  Check orthostatics. 2. Right shoulder fracture and nasal bone fracture -follow with Dr. Linna CapriceSwinteck orthopedic surgeon. 3. History of paroxysmal atrial fibrillation not on anticoagulation secondary to GI bleed as per the patient.  On metoprolol. 4. History of anxiety on BuSpar.   DVT prophylaxis: SCDs. Code Status: Full code. Family Communication: Discussed with patient. Disposition Plan: Home. Consults called: Cardiology. Admission status: Observation.   Eduard ClosArshad N Henya Aguallo MD [facility] Pager [phone].  If 7PM-7AM, please contact night-coverage [email or web address] Password [email or web address]  02/15/2019, 8:09 PM

## 2019-02-15 NOTE — ED Notes (Signed)
Purewick applied to patient- pt aware of the need for urine sample

## 2019-02-15 NOTE — ED Notes (Signed)
Spoke with pt husband and informed of plan of care- pt on phone with husband now

## 2019-02-15 NOTE — ED Notes (Signed)
[employer] called and will come interrogate pacemaker STAT

## 2019-02-15 NOTE — ED Notes (Signed)
Facial/nasal swelling, bruising, abrasions to nose lip.  S/p fall

## 2019-02-15 NOTE — ED Notes (Signed)
Per [employer] rep- pacemaker functioning with no abnormalities.

## 2019-02-15 NOTE — ED Notes (Addendum)
Patients pmd sent over information, given to Dr Sedonia Small, MD at bedside

## 2019-02-15 NOTE — ED Notes (Signed)
[facility] arrived to transport pt to [facility] 

## 2019-02-15 NOTE — ED Triage Notes (Signed)
Pt states fell today, face first, complains right arm pain, facial pain, abrasions nose, forehead. States has been having frequent falls, dizziness. Pt awake alert oriented , answering all questions appropriately. Does not take blood thinner.

## 2019-02-15 NOTE — ED Notes (Signed)
Patient transported to CT 

## 2019-02-15 NOTE — ED Notes (Signed)
[employer] rep at bedside

## 2019-02-15 NOTE — Progress Notes (Addendum)
Patient arrived from [facility].  PIV to left AC WNL.  Patient placed on telemetry.  Patient alert and oriented.  Yellow socks and fall risk arm band placed on patient.  Patient educated on call bell use - verbalized understanding.    Admitting notified of patient's arrival to unit.

## 2019-02-15 NOTE — Progress Notes (Signed)
RN notified by patient placement that Dr. Roel Cluck will be placing admission orders.

## 2019-02-15 NOTE — ED Provider Notes (Addendum)
[facility] Emergency Department Provider Note MRN:  [id number]  Arrival date & time: 02/15/19     Chief Complaint   Fall and Dizziness   History of Present Illness   Kayla Beard is a 73 y.o. year-old female with a history of hypertension, A. fib, pacemaker presenting to the ED with chief complaint of fall, dizziness.  Patient explains that she fell today and this is her third time falling in the past month.  Usually she attributes it to her legs becoming weak and giving out.  Today, she was walking to the end of her driveway, and when she began walking back to her house she became lightheaded and then possibly passed out.  Struck the front of her face against the ground.  Unwitnessed.  Endorsing severe right shoulder pain that is worse with motion or palpation.  Denies chest pain or shortness of breath, no abdominal pain, no back pain, no numbness or weakness to the arms or legs.  Review of Systems  A complete 10 system review of systems was obtained and all systems are negative except as noted in the HPI and PMH.   Patient's Health History    Past Medical History:  Diagnosis Date  . Anxiety   . Depression   . GERD (gastroesophageal reflux disease)   . Hypercholesterolemia   . Hypertension   . Obsessive compulsive disorder     Past Surgical History:  Procedure Laterality Date  . APPENDECTOMY    . CHOLECYSTECTOMY    . TONSILLECTOMY      History reviewed. No pertinent family history.  Social History   Socioeconomic History  . Marital status: Married    Spouse name: Not on file  . Number of children: Not on file  . Years of education: Not on file  . Highest education level: Not on file  Occupational History  . Not on file  Social Needs  . Financial resource strain: Not on file  . Food insecurity    Worry: Not on file    Inability: Not on file  . Transportation needs    Medical: Not on file    Non-medical: Not on file  Tobacco Use  .  Smoking status: Never Smoker  . Smokeless tobacco: Never Used  Substance and Sexual Activity  . Alcohol use: No  . Drug use: No  . Sexual activity: Not on file  Lifestyle  . Physical activity    Days per week: Not on file    Minutes per session: Not on file  . Stress: Not on file  Relationships  . Social Musicianconnections    Talks on phone: Not on file    Gets together: Not on file    Attends religious service: Not on file    Active member of club or organization: Not on file    Attends meetings of clubs or organizations: Not on file    Relationship status: Not on file  . Intimate partner violence    Fear of current or ex partner: Not on file    Emotionally abused: Not on file    Physically abused: Not on file    Forced sexual activity: Not on file  Other Topics Concern  . Not on file  Social History Narrative  . Not on file     Physical Exam  Vital Signs and Nursing Notes reviewed Vitals:   02/15/19 1400 02/15/19 1415  BP: 102/62   Pulse: 71 70  Resp: (!) 9 15  Temp:  SpO2: 92% 92%    CONSTITUTIONAL: Chronically ill-appearing, NAD NEURO:  Alert and oriented x 3, no focal deficits EYES:  eyes equal and reactive, normal extraocular movements ENT/NECK:  no LAD, no JVD; bruising to the nose, no septal hematoma CARDIO: Regular rate, well-perfused, normal S1 and S2 PULM:  CTAB no wheezing or rhonchi GI/GU:  normal bowel sounds, non-distended, non-tender MSK/SPINE:  No gross deformities, no edema; significant tenderness palpation of the right shoulder, neurovascularly intact distally SKIN:  no rash, atraumatic PSYCH:  Appropriate speech and behavior  Diagnostic and Interventional Summary    EKG Interpretation  Date/Time:  Wednesday February 15 2019 12:15:10 EDT Ventricular Rate:  91 PR Interval:    QRS Duration: 81 QT Interval:  350 QTC Calculation: 426 R Axis:   8 Text Interpretation:  Atrial-paced complexes Low voltage, precordial leads Borderline abnrm T,  anterolateral leads Confirmed by Kennis CarinaBero, Yanai Hobson [phone]([id number]) on 02/15/2019 12:45:59 PM      Labs Reviewed  COMPREHENSIVE METABOLIC PANEL - Abnormal; Notable for the following components:      Result Value   Glucose, Bld 129 (*)    Calcium 8.4 (*)    AST 42 (*)    All other components within normal limits  SARS CORONAVIRUS 2 (HOSP ORDER, PERFORMED IN [place] [facility] VIA [employer] ID)  CBC  URINALYSIS, ROUTINE W REFLEX MICROSCOPIC    DG Shoulder Right  Final Result    DG Chest 1 View  Final Result    CT HEAD WO CONTRAST  Final Result    CT CERVICAL SPINE WO CONTRAST  Final Result    CT MAXILLOFACIAL WO CONTRAST  Final Result      Medications  sodium chloride 0.9 % bolus 500 mL (0 mLs Intravenous Stopped 02/15/19 1445)  HYDROmorphone (DILAUDID) injection 1 mg (1 mg Intravenous Given 02/15/19 1302)     Procedures Critical Care  ED Course and Medical Decision Making  I have reviewed the triage vital signs and the nursing notes.  Pertinent labs & imaging results that were available during my care of the patient were reviewed by me and considered in my medical decision making (see below for details).  Syncopal episode and ground-level fall in this 73 year old female with pacemaker, hypertension, obesity.  Patient denies chest pain before or after the syncopal event, EKG is without ischemic changes.  Will interrogate the pacer, obtain labs to evaluate for metabolic disarray, UA to rule out UTI, x-ray and CT imaging to exclude significant traumatic injuries.  Work-up reveals nasal bone fracture, proximal humerus fracture.  Awaiting callback from orthopedics for recommendations regarding humerus fracture.  Admitted to hospital service for further care of high risk syncope.  Pacemaker was interrogated and there were no events.  3:38 PM update: Spoke with Dr. Joanna PuffSprintec of orthopedics, who recommends sling and follow-up with Dr. Aundria Rudogers in 1 week.  Elmer SowMichael M. Pilar PlateBero, MD [facility]  [facility] [email or web address]  Final Clinical Impressions(s) / ED Diagnoses     ICD-10-CM   1. Syncope, unspecified syncope type  R55   2. Fall  W19.Lorne [id number] DG Chest 1 View    DG Chest 1 View    CANCELED: DG Chest 2 View    CANCELED: DG Chest 2 View  3. Closed fracture of proximal end of right humerus, unspecified fracture morphology, initial encounter  S42.201A   4. Closed fracture of nasal bone, initial encounter  S02.2XXA     ED Discharge Orders    None  Maudie Flakes, MD 02/15/19 1521    Maudie Flakes, MD 02/15/19 1539

## 2019-02-15 NOTE — ED Notes (Addendum)
Berneta Sages (Husband) Cell Phone- [phone]

## 2019-02-16 ENCOUNTER — Inpatient Hospital Stay (HOSPITAL_COMMUNITY): Payer: Medicare HMO

## 2019-02-16 DIAGNOSIS — Z5309 Procedure and treatment not carried out because of other contraindication: Secondary | ICD-10-CM

## 2019-02-16 DIAGNOSIS — I351 Nonrheumatic aortic (valve) insufficiency: Secondary | ICD-10-CM

## 2019-02-16 DIAGNOSIS — W19XXXA Unspecified fall, initial encounter: Secondary | ICD-10-CM

## 2019-02-16 DIAGNOSIS — I480 Paroxysmal atrial fibrillation: Secondary | ICD-10-CM

## 2019-02-16 DIAGNOSIS — Z950 Presence of cardiac pacemaker: Secondary | ICD-10-CM

## 2019-02-16 DIAGNOSIS — R55 Syncope and collapse: Secondary | ICD-10-CM

## 2019-02-16 DIAGNOSIS — I361 Nonrheumatic tricuspid (valve) insufficiency: Secondary | ICD-10-CM

## 2019-02-16 LAB — URINALYSIS, ROUTINE W REFLEX MICROSCOPIC
Bilirubin Urine: NEGATIVE
Glucose, UA: NEGATIVE mg/dL
Hgb urine dipstick: NEGATIVE
Ketones, ur: 5 mg/dL — AB
Nitrite: NEGATIVE
Protein, ur: NEGATIVE mg/dL
Specific Gravity, Urine: 1.027 (ref 1.005–1.030)
pH: 5 (ref 5.0–8.0)

## 2019-02-16 LAB — BASIC METABOLIC PANEL
Anion gap: 10 (ref 5–15)
BUN: 12 mg/dL (ref 8–23)
CO2: 22 mmol/L (ref 22–32)
Calcium: 8.1 mg/dL — ABNORMAL LOW (ref 8.9–10.3)
Chloride: 104 mmol/L (ref 98–111)
Creatinine, Ser: 0.69 mg/dL (ref 0.44–1.00)
GFR calc Af Amer: >60 mL/min (ref >60)
GFR calc non Af Amer: >60 mL/min (ref >60)
Glucose, Bld: 128 mg/dL — ABNORMAL HIGH (ref 70–99)
Potassium: 3.7 mmol/L (ref 3.5–5.1)
Sodium: 136 mmol/L (ref 135–145)

## 2019-02-16 LAB — CBC
HCT: 37.1 % (ref 36.0–46.0)
Hemoglobin: 11.9 g/dL — ABNORMAL LOW (ref 12.0–15.0)
MCH: 30.5 pg (ref 26.0–34.0)
MCHC: 32.1 g/dL (ref 30.0–36.0)
MCV: 95.1 fL (ref 80.0–100.0)
Platelets: 227 10*3/uL (ref 150–400)
RBC: 3.9 MIL/uL (ref 3.87–5.11)
RDW: 12.6 % (ref 11.5–15.5)
WBC: 14 10*3/uL — ABNORMAL HIGH (ref 4.0–10.5)
nRBC: 0 % (ref 0.0–0.2)

## 2019-02-16 LAB — ECHOCARDIOGRAM COMPLETE
Height: 60 in
Weight: 2624 oz

## 2019-02-16 MED ORDER — POTASSIUM CHLORIDE IN NACL 20-0.9 MEQ/L-% IV SOLN
INTRAVENOUS | Status: AC
  Administered 2019-02-16: 03:00:00 via INTRAVENOUS
  Filled 2019-02-16: qty 1000

## 2019-02-16 NOTE — Progress Notes (Signed)
  Echocardiogram 2D Echocardiogram has been performed.  Kayla Beard M 02/16/2019, 2:33 PM

## 2019-02-16 NOTE — Plan of Care (Signed)

## 2019-02-16 NOTE — Progress Notes (Addendum)
[facility]                                                                              Patient Demographics  Kayla Mulenne Nau, is a 73 y.o. female, DOB - 03/25/1946, ZOX:[id number]  Admit date - 02/15/2019   Admitting Physician Noralee StainJennifer Choi, DO  Outpatient Primary MD for the patient is Forrest Moronuehle, Stephen, MD  Outpatient specialists:   LOS - 1  days   Medical records reviewed and are as summarized below:    Chief Complaint  Patient presents with   Fall   Dizziness       Brief summary   Patient is a 73 year old female with history of tachybradycardia syndrome, status post pacemaker, paroxysmal A. fib had a syncopal episode at home while walking.  Patient reports that over the last few months, she has been having falls due to legs giving up.  She had followed up with neurology outpatient who felt her symptoms were due to neuropathy.  Patient was walking on the driveway to the road, her legs gave way and she fell onto the ground and hit her face. In ED, CT head Facial and C-spine showed nasal bone fracture with small hematoma.  Right shoulder x-ray showed right humerus fracture. Orthopedics was consulted, recommended sling for the right humerus fracture and follow outpatient.  Assessment & Plan    Principal Problem:   Syncope: Possibly vasovagal syncope -Patient had mild dizziness prior to the fall however she has been having recurrent falls. Patient has followed with neurology outpatient, was attributed to neuropathy. -CK 43, troponin x2 neg for ACS. -Cardiology consulted, will interrogate pacemaker, will follow recommendations from cardiology  Active Problems:   Falls -PT OT evaluation pending    Closed fracture of right proximal humerus -Orthopedics was consulted, recommended right shoulder sling and outpatient follow-up -Shoulder sling ordered    PAF (paroxysmal atrial fibrillation) (HCC) -Currently rate controlled, has dual-chamber pacemaker -Cardiology  consulted, will interrogate PPM -No anticoagulation secondary to bleeding    Closed fracture of nasal bones CT maxillofacial showed mildly comminuted right nasal bone fracture, nondisplaced left nasal bone fracture, overlying soft tissue swelling - d/w Dr. Lazarus SalinesWolicki, ENT, recommended no intervention while it is swollen.  Recommended ice pack, will see patient in the office in 1 week once the swelling is down.  Likely will not need any surgical intervention.  Code Status:  Full  DVT Prophylaxis: SCD's Family Communication: Discussed in detail with the patient, all imaging results, lab results explained to the patient's husband on phone.    Disposition Plan: work up in progress, safety issue at home with multiple falls at home, right shoulder fracture, nasal bone fractures  Time Spent in minutes  35 mins   Procedures:  None   Consultants:   Cardiology ENT Orthopedics   Antimicrobials:   Anti-infectives (From admission, onward)   None          Medications  Scheduled Meds:  ALPRAZolam  0.5 mg Oral TID   busPIRone  15 mg Oral TID   metoprolol succinate  50 mg Oral Daily   rosuvastatin  10 mg Oral Daily   Continuous  Infusions:  0.9 % NaCl with KCl 20 mEq / L 75 mL/hr at 02/16/19 0320   PRN Meds:.acetaminophen **OR** acetaminophen, ondansetron **OR** ondansetron (ZOFRAN) IV      Subjective:   Kayla Beard was seen and examined today. Feels miserable with right shoulder discomfort, nasal swelling and pain.  Currently no dizziness or lightheadedness. Patient denies chest pain, shortness of breath, abdominal pain, N/V/D/C, new weakness, numbess, tingling. No acute events overnight.    Objective:   Vitals:   02/16/19 0455 02/16/19 0536 02/16/19 0539 02/16/19 1159  BP: 128/73 112/77 114/71 116/69  Pulse: 85 88 90 80  Resp: (!) 21   18  Temp: 99.5 F (37.5 C)   98.5 F (36.9 C)  TempSrc: Oral   Oral  SpO2: 93%   93%  Weight:      Height:         Intake/Output Summary (Last 24 hours) at 02/16/2019 1203 Last data filed at 02/16/2019 0848 Gross per 24 hour  Intake 1517.09 ml  Output 100 ml  Net 1417.09 ml     Wt Readings from Last 3 Encounters:  02/15/19 74.4 kg  03/12/11 81.6 kg     Exam  General: Alert and oriented x 3, NAD  Eyes: PERRLA, EOMI  HEENT: Bruising and small hematoma on the nasal bridge  Cardiovascular: S1 S2 auscultated, Regular rate and rhythm.  Respiratory: Clear to auscultation bilaterally, no wheezing, rales or rhonchi  Gastrointestinal: Soft, nontender, nondistended, + bowel sounds  Ext: no pedal edema bilaterally  Neuro: Strength 5/5 lower extremities bilaterally, speech clear  Musculoskeletal: No digital cyanosis, clubbing  Skin: No rashes  Psych: Normal affect and demeanor, alert and oriented x3    Data Reviewed:  I have personally reviewed following labs and imaging studies  Micro Results Recent Results (from the past 240 hour(s))  SARS Coronavirus 2 (Hosp order,Performed [facility] via [employer] ID)     Status: None   Collection Time: 02/15/19  1:37 PM   Specimen: Dry Nasal Swab ([employer] ID Now)  Result Value Ref Range Status   SARS Coronavirus 2 ([employer] ID Now) NEGATIVE NEGATIVE Final    Comment: (NOTE) SARS-CoV-2 target nucleic acids are NOT DETECTED. The SARS-CoV-2 RNA is generally detectable in upper and lower respiratory specimens during the acute phase of infection.  Negativeresults do not preclude SARS-CoV-2 infection, do not rule out coinfections with other pathogens, and should not be used as the  sole basis for treatment or other patient management decisions.  Negative results must be combined with clinical observations, patient history, and epidemiological information. The expected result is Negative. Fact Sheet for Patients: http://www.graves-[place].org/https://www.fda.gov/media/136524/download Fact Sheet for Healthcare Providers: EnviroConcern.sihttps://www.fda.gov/media/136523/download This test is  not yet approved or cleared by the [place] FDA and  has been authorized for detection and/or diagnosis of SARS-CoV-2 by FDA under an Emergency Use Authorization (EUA).  This EUA will remain in effect (meaning this test can be used) for the duration of  the COVID19 declaration under Section 5 64(b)(1) of the Act, 21 U.S.C.  section 619 510 [id number] 3(b)(1), unless the authorization is terminated or revoked sooner. Performed at [facility] [place], [address] [place], [place] [address]     Radiology Reports Dg Chest 1 View  Result Date: 02/15/2019 CLINICAL DATA:  Fall. EXAM: CHEST  1 VIEW COMPARISON:  Radiographs of June 13, 2018. FINDINGS: The heart size and mediastinal contours are within normal limits. No pneumothorax or pleural effusion is noted. Left-sided pacemaker is  unchanged in position. Both lungs are clear. Mildly displaced proximal right humeral neck fracture is noted. IMPRESSION: No acute cardiopulmonary abnormality seen. Mildly displaced proximal right humeral neck fracture. Electronically Signed   By: Lupita Raider M.D.   On: 02/15/2019 13:55   Dg Shoulder Right  Result Date: 02/15/2019 CLINICAL DATA:  Right shoulder pain after fall today. EXAM: RIGHT SHOULDER - 2+ VIEW COMPARISON:  None. FINDINGS: Mildly displaced proximal right humeral neck fracture is noted. Severe degenerative changes seen involving the right glenohumeral joint. Visualized ribs are unremarkable. IMPRESSION: Mildly displaced proximal right humeral neck fracture. Electronically Signed   By: Lupita Raider M.D.   On: 02/15/2019 13:56   Ct Head Wo Contrast  Result Date: 02/15/2019 CLINICAL DATA:  Status post fall today and landed on the face. Regions to the forehead, nose, lips EXAM: CT HEAD WITHOUT CONTRAST CT MAXILLOFACIAL WITHOUT CONTRAST CT CERVICAL SPINE WITHOUT CONTRAST TECHNIQUE: Multidetector CT imaging of the head, cervical spine, and maxillofacial structures were performed using the standard  protocol without intravenous contrast. Multiplanar CT image reconstructions of the cervical spine and maxillofacial structures were also generated. COMPARISON:  None. FINDINGS: CT HEAD FINDINGS Brain: No evidence of acute infarction, hemorrhage, extra-axial collection, ventriculomegaly, or mass effect. Generalized cerebral atrophy. Periventricular white matter low attenuation likely secondary to microangiopathy. Vascular: Cerebrovascular atherosclerotic calcifications are noted. Skull: Negative for fracture or focal lesion. Sinuses/Orbits: Visualized portions of the orbits are unremarkable. Visualized portions of the paranasal sinuses and mastoid air cells are unremarkable. Other: None. CT MAXILLOFACIAL FINDINGS Osseous: No mandibular fracture or dislocation. Mildly comminuted right nasal bone fracture. Nondisplaced left nasal bone fracture. Overlying soft tissue swelling. Temporomandibular joints are normal. No aggressive osseous lesion. Orbits: Negative. No traumatic or inflammatory finding. Sinuses: Paranasal sinuses are clear. Mastoid sinuses are clear. Rightward deviation of the nasal septum. Soft tissues: No fluid collection or hematoma. Soft tissue swelling over the nose. CT CERVICAL SPINE FINDINGS Alignment: Normal. Skull base and vertebrae: No acute fracture. No primary bone lesion or focal pathologic process. Soft tissues and spinal canal: No prevertebral fluid or swelling. No visible canal hematoma. Disc levels: Degenerative disease with disc height loss at C6-7 and C7-T1. Right uncovertebral degenerative changes at C6-7 and C7-T1 with foraminal stenosis. Bilateral facet arthropathy at C7-T1. Upper chest: Lung apices are clear. Other: No fluid collection or hematoma. IMPRESSION: 1. No acute intracranial pathology. 2. Mildly comminuted right nasal bone fracture. Nondisplaced left nasal bone fracture. Overlying soft tissue swelling. 3.  No acute osseous injury of the cervical spine. Electronically Signed    By: Elige Ko   On: 02/15/2019 13:44   Ct Cervical Spine Wo Contrast  Result Date: 02/15/2019 CLINICAL DATA:  Status post fall today and landed on the face. Regions to the forehead, nose, lips EXAM: CT HEAD WITHOUT CONTRAST CT MAXILLOFACIAL WITHOUT CONTRAST CT CERVICAL SPINE WITHOUT CONTRAST TECHNIQUE: Multidetector CT imaging of the head, cervical spine, and maxillofacial structures were performed using the standard protocol without intravenous contrast. Multiplanar CT image reconstructions of the cervical spine and maxillofacial structures were also generated. COMPARISON:  None. FINDINGS: CT HEAD FINDINGS Brain: No evidence of acute infarction, hemorrhage, extra-axial collection, ventriculomegaly, or mass effect. Generalized cerebral atrophy. Periventricular white matter low attenuation likely secondary to microangiopathy. Vascular: Cerebrovascular atherosclerotic calcifications are noted. Skull: Negative for fracture or focal lesion. Sinuses/Orbits: Visualized portions of the orbits are unremarkable. Visualized portions of the paranasal sinuses and mastoid air cells are unremarkable. Other: None. CT MAXILLOFACIAL FINDINGS Osseous: No  mandibular fracture or dislocation. Mildly comminuted right nasal bone fracture. Nondisplaced left nasal bone fracture. Overlying soft tissue swelling. Temporomandibular joints are normal. No aggressive osseous lesion. Orbits: Negative. No traumatic or inflammatory finding. Sinuses: Paranasal sinuses are clear. Mastoid sinuses are clear. Rightward deviation of the nasal septum. Soft tissues: No fluid collection or hematoma. Soft tissue swelling over the nose. CT CERVICAL SPINE FINDINGS Alignment: Normal. Skull base and vertebrae: No acute fracture. No primary bone lesion or focal pathologic process. Soft tissues and spinal canal: No prevertebral fluid or swelling. No visible canal hematoma. Disc levels: Degenerative disease with disc height loss at C6-7 and C7-T1. Right  uncovertebral degenerative changes at C6-7 and C7-T1 with foraminal stenosis. Bilateral facet arthropathy at C7-T1. Upper chest: Lung apices are clear. Other: No fluid collection or hematoma. IMPRESSION: 1. No acute intracranial pathology. 2. Mildly comminuted right nasal bone fracture. Nondisplaced left nasal bone fracture. Overlying soft tissue swelling. 3.  No acute osseous injury of the cervical spine. Electronically Signed   By: Elige KoHetal  Patel   On: 02/15/2019 13:44   Ct Maxillofacial Wo Contrast  Result Date: 02/15/2019 CLINICAL DATA:  Status post fall today and landed on the face. Regions to the forehead, nose, lips EXAM: CT HEAD WITHOUT CONTRAST CT MAXILLOFACIAL WITHOUT CONTRAST CT CERVICAL SPINE WITHOUT CONTRAST TECHNIQUE: Multidetector CT imaging of the head, cervical spine, and maxillofacial structures were performed using the standard protocol without intravenous contrast. Multiplanar CT image reconstructions of the cervical spine and maxillofacial structures were also generated. COMPARISON:  None. FINDINGS: CT HEAD FINDINGS Brain: No evidence of acute infarction, hemorrhage, extra-axial collection, ventriculomegaly, or mass effect. Generalized cerebral atrophy. Periventricular white matter low attenuation likely secondary to microangiopathy. Vascular: Cerebrovascular atherosclerotic calcifications are noted. Skull: Negative for fracture or focal lesion. Sinuses/Orbits: Visualized portions of the orbits are unremarkable. Visualized portions of the paranasal sinuses and mastoid air cells are unremarkable. Other: None. CT MAXILLOFACIAL FINDINGS Osseous: No mandibular fracture or dislocation. Mildly comminuted right nasal bone fracture. Nondisplaced left nasal bone fracture. Overlying soft tissue swelling. Temporomandibular joints are normal. No aggressive osseous lesion. Orbits: Negative. No traumatic or inflammatory finding. Sinuses: Paranasal sinuses are clear. Mastoid sinuses are clear. Rightward  deviation of the nasal septum. Soft tissues: No fluid collection or hematoma. Soft tissue swelling over the nose. CT CERVICAL SPINE FINDINGS Alignment: Normal. Skull base and vertebrae: No acute fracture. No primary bone lesion or focal pathologic process. Soft tissues and spinal canal: No prevertebral fluid or swelling. No visible canal hematoma. Disc levels: Degenerative disease with disc height loss at C6-7 and C7-T1. Right uncovertebral degenerative changes at C6-7 and C7-T1 with foraminal stenosis. Bilateral facet arthropathy at C7-T1. Upper chest: Lung apices are clear. Other: No fluid collection or hematoma. IMPRESSION: 1. No acute intracranial pathology. 2. Mildly comminuted right nasal bone fracture. Nondisplaced left nasal bone fracture. Overlying soft tissue swelling. 3.  No acute osseous injury of the cervical spine. Electronically Signed   By: Elige KoHetal  Patel   On: 02/15/2019 13:44    Lab Data:  CBC: Recent Labs  Lab 02/15/19 1251 02/16/19 0553  WBC 7.7 14.0*  HGB 12.0 11.9*  HCT 36.2 37.1  MCV 91.0 95.1  PLT 232 227   Basic Metabolic Panel: Recent Labs  Lab 02/15/19 1251 02/16/19 0553  NA 137 136  K 3.5 3.7  CL 103 104  CO2 22 22  GLUCOSE 129* 128*  BUN 10 12  CREATININE 0.92 0.69  CALCIUM 8.4* 8.1*   GFR: Estimated Creatinine  Clearance: 58.1 mL/min (by C-G formula based on SCr of 0.69 mg/dL). Liver Function Tests: Recent Labs  Lab 02/15/19 1251  AST 42*  ALT 41  ALKPHOS 63  BILITOT 0.9  PROT 6.7  ALBUMIN 3.6   No results for input(s): LIPASE, AMYLASE in the last 168 hours. No results for input(s): AMMONIA in the last 168 hours. Coagulation Profile: No results for input(s): INR, PROTIME in the last 168 hours. Cardiac Enzymes: Recent Labs  Lab 02/15/19 2030  CKTOTAL 43   BNP (last 3 results) No results for input(s): PROBNP in the last 8760 hours. HbA1C: No results for input(s): HGBA1C in the last 72 hours. CBG: No results for input(s): GLUCAP in the  last 168 hours. Lipid Profile: No results for input(s): CHOL, HDL, LDLCALC, TRIG, CHOLHDL, LDLDIRECT in the last 72 hours. Thyroid Function Tests: No results for input(s): TSH, T4TOTAL, FREET4, T3FREE, THYROIDAB in the last 72 hours. Anemia Panel: No results for input(s): VITAMINB12, FOLATE, FERRITIN, TIBC, IRON, RETICCTPCT in the last 72 hours. Urine analysis:    Component Value Date/Time   COLORURINE YELLOW 02/16/2019 0854   APPEARANCEUR TURBID (A) 02/16/2019 0854   LABSPEC 1.027 02/16/2019 0854   PHURINE 5.0 02/16/2019 0854   GLUCOSEU NEGATIVE 02/16/2019 0854   HGBUR NEGATIVE 02/16/2019 0854   BILIRUBINUR NEGATIVE 02/16/2019 0854   KETONESUR 5 (A) 02/16/2019 0854   PROTEINUR NEGATIVE 02/16/2019 0854   UROBILINOGEN 0.2 03/12/2011 1146   NITRITE NEGATIVE 02/16/2019 0854   LEUKOCYTESUR TRACE (A) 02/16/2019 0854     Deklan Minar M.D. [facility] 02/16/2019, 12:03 PM  Pager: [phone] Between 7am to 7pm - call Pager - [phone]  After 7pm go to [email or web address] - password [email or web address]  Call night coverage person covering after 7pm

## 2019-02-16 NOTE — Consult Note (Addendum)
Cardiology Consultation:   Patient ID: Kayla Beard MRN: [id number]; DOB: 04/18/1946  Admit date: 02/15/2019 Date of Consult: 02/16/2019  Primary Care Provider: Forrest Moronuehle, Stephen, MD Primary [facility]ologist: [facility] - Dr. Rudolpho SevinAkbary Primary Electrophysiologist:  None   Patient Profile:   Kayla Beard is a 73 y.o. female with a hx of tachybrady syndrome s/p dual chamber PPM ([employer]), paroxysmal atrial fibrillation on metoprolol who is being seen today for the evaluation of syncope at the request of Dr. Isidoro Donningai.  History of Present Illness:   Kayla Beard [facility]. Her last clinic visit with them on 07/01/18 without complications. She is intolerant to multaq with complicated long QT. OSA not on CPAP. She is not anticoagulated due to bleeding, but has not had recurrent episodes of Afib (per CareEverywhere note). She was previously on lopressor and flecainide, but she states she could not tolerate the flecainide.   On my interview, she states she just started a walking program with her PCP's guidance. She started walking 1/2 block at her home. Yesterday was the 5th walking day. On her way back home, she felt mildly lightheaded and passed out. She was helped by a bystander (nurse) and [facility]. She denies chest pain and palpitations prior to her LOC. She states she had another syncopal episode approximately 2 weeks ago, but did not seek evaluation. At that occurrence, she was bringing the mail into the living room to her husband and had LOC. She did not hit her head during that episode. Head CT negative for bleed. She complains of right shoulder pain. Bruising and abrasions on face and right hand. She denies chest pain and SOB. She was previously unable to tolerate xarelto due to bleeding after a colonoscopy and epistaxis.   Nuclear medicine stress test 2019 - negative for scar and inducible ischemia   Heart Pathway Score:     Past Medical History:  Diagnosis Date   Anxiety      Asthma    Depression    GERD (gastroesophageal reflux disease)    Hypercholesterolemia    Hypertension    Obsessive compulsive disorder     Past Surgical History:  Procedure Laterality Date   APPENDECTOMY     CHOLECYSTECTOMY     TONSILLECTOMY       Home Medications:  Prior to Admission medications   Medication Sig Start Date End Date Taking? Authorizing Provider  ALPRAZolam Prudy Feeler(XANAX) 1 MG tablet Take 0.5 mg by mouth 3 (three) times daily. 01/29/19  Yes [provider]  busPIRone (BUSPAR) 15 MG tablet Take 15 mg by mouth 3 (three) times daily. 12/02/18  Yes [provider]  metoprolol (TOPROL-XL) 50 MG 24 hr tablet Take 50 mg by mouth daily.     Yes [provider]  rosuvastatin (CRESTOR) 10 MG tablet Take 10 mg by mouth daily. 11/19/18  Yes [provider]  lansoprazole (PREVACID SOLUTAB) 30 MG disintegrating tablet Take 1 tablet (30 mg total) by mouth daily. 03/12/11 03/11/12  Linwood DibblesKnapp, Jon, MD  sucralfate (CARAFATE) 1 G tablet Take 1 tablet (1 g total) by mouth 4 (four) times daily. 03/12/11 03/11/12  Linwood DibblesKnapp, Jon, MD    Inpatient Medications: Scheduled Meds:  ALPRAZolam  0.5 mg Oral TID   busPIRone  15 mg Oral TID   metoprolol succinate  50 mg Oral Daily   rosuvastatin  10 mg Oral Daily   Continuous Infusions:  0.9 % NaCl with KCl 20 mEq / L 75 mL/hr at 02/16/19 0320  PRN Meds: acetaminophen **OR** acetaminophen, ondansetron **OR** ondansetron (ZOFRAN) IV  Allergies:    Allergies  Allergen Reactions   Hydroxyzine     Other reaction(s): Other (See Comments) Sleepiness Nightmares    Prednisone Anxiety    Other reaction(s): Other (See Comments)   Rivaroxaban Other (See Comments)    Bleeding Bleeding    Doxepin    Levofloxacin Diarrhea   Metformin And Related Diarrhea    Social History:   Social History   Socioeconomic History   Marital status: Married    Spouse name: Not on file   Number of children: Not on  file   Years of education: Not on file   Highest education level: Not on file  Occupational History   Not on file  Social Needs   Financial resource strain: Not on file   Food insecurity    Worry: Not on file    Inability: Not on file   Transportation needs    Medical: Not on file    Non-medical: Not on file  Tobacco Use   Smoking status: Never Smoker   Smokeless tobacco: Never Used  Substance and Sexual Activity   Alcohol use: No   Drug use: No   Sexual activity: Not on file  Lifestyle   Physical activity    Days per week: Not on file    Minutes per session: Not on file   Stress: Not on file  Relationships   Social connections    Talks on phone: Not on file    Gets together: Not on file    Attends religious service: Not on file    Active member of club or organization: Not on file    Attends meetings of clubs or organizations: Not on file    Relationship status: Not on file   Intimate partner violence    Fear of current or ex partner: Not on file    Emotionally abused: Not on file    Physically abused: Not on file    Forced sexual activity: Not on file  Other Topics Concern   Not on file  Social History Narrative   Not on file    Family History:    Family History  Family history unknown: Yes     ROS:  Please see the history of present illness.   All other ROS reviewed and negative.     Physical Exam/Data:   Vitals:   02/15/19 2034 02/16/19 0455 02/16/19 0536 02/16/19 0539  BP: 130/85 128/73 112/77 114/71  Pulse: 70 85 88 90  Resp: 19 (!) 21    Temp: 98.2 F (36.8 C) 99.5 F (37.5 C)    TempSrc: Oral Oral    SpO2: 100% 93%    Weight:      Height:        Intake/Output Summary (Last 24 hours) at 02/16/2019 1023 Last data filed at 02/16/2019 0848 Gross per 24 hour  Intake 1517.09 ml  Output 100 ml  Net 1417.09 ml   Last 3 Weights 02/15/2019 03/12/2011  Weight (lbs) 164 lb 180 lb  Weight (kg) 74.39 kg 81.647 kg     Body mass index  is 32.03 kg/m.  General:  Well nourished, well developed, in no acute distress HEENT: normal Neck: no JVD Vascular: No carotid bruits Cardiac:  normal S1, S2; RRR; no murmur  Lungs:  clear to auscultation bilaterally, no wheezing, rhonchi or rales  Abd: soft, nontender, no hepatomegaly  Ext: no edema Musculoskeletal:  No deformities, BUE and  BLE strength normal and equal Skin: warm and dry  Neuro:  CNs 2-12 intact, no focal abnormalities noted Psych:  Normal affect   EKG:  The EKG was personally reviewed and demonstrates:  Sinus rhythm with atrial paced complexes and PACs, ventricular rate 91 Telemetry:  Telemetry was personally reviewed and demonstrates:  Sinus rhythm, A-pacing, periods of AV pacing, HR generally in the 80-90s  Relevant CV Studies:  Stress test 2018: No evidence of prior infarct or reversible ischemia  Zio patch - 09/2017  Laboratory Data:  High Sensitivity Troponin:   Recent Labs  Lab 02/15/19 2030 02/15/19 2231  TROPONINIHS <2 <2.00     Cardiac EnzymesNo results for input(s): TROPONINI in the last 168 hours. No results for input(s): TROPIPOC in the last 168 hours.  Chemistry Recent Labs  Lab 02/15/19 1251 02/16/19 0553  NA 137 136  K 3.5 3.7  CL 103 104  CO2 22 22  GLUCOSE 129* 128*  BUN 10 12  CREATININE 0.92 0.69  CALCIUM 8.4* 8.1*  GFRNONAA >60 >60  GFRAA >60 >60  ANIONGAP 12 10    Recent Labs  Lab 02/15/19 1251  PROT 6.7  ALBUMIN 3.6  AST 42*  ALT 41  ALKPHOS 63  BILITOT 0.9   Hematology Recent Labs  Lab 02/15/19 1251 02/16/19 0553  WBC 7.7 14.0*  RBC 3.98 3.90  HGB 12.0 11.9*  HCT 36.2 37.1  MCV 91.0 95.1  MCH 30.2 30.5  MCHC 33.1 32.1  RDW 12.3 12.6  PLT 232 227   BNPNo results for input(s): BNP, PROBNP in the last 168 hours.  DDimer No results for input(s): DDIMER in the last 168 hours.   Radiology/Studies:  Dg Chest 1 View  Result Date: 02/15/2019 CLINICAL DATA:  Fall. EXAM: CHEST  1 VIEW COMPARISON:   Radiographs of June 13, 2018. FINDINGS: The heart size and mediastinal contours are within normal limits. No pneumothorax or pleural effusion is noted. Left-sided pacemaker is unchanged in position. Both lungs are clear. Mildly displaced proximal right humeral neck fracture is noted. IMPRESSION: No acute cardiopulmonary abnormality seen. Mildly displaced proximal right humeral neck fracture. Electronically Signed   By: Marijo Conception M.D.   On: 02/15/2019 13:55   Dg Shoulder Right  Result Date: 02/15/2019 CLINICAL DATA:  Right shoulder pain after fall today. EXAM: RIGHT SHOULDER - 2+ VIEW COMPARISON:  None. FINDINGS: Mildly displaced proximal right humeral neck fracture is noted. Severe degenerative changes seen involving the right glenohumeral joint. Visualized ribs are unremarkable. IMPRESSION: Mildly displaced proximal right humeral neck fracture. Electronically Signed   By: Marijo Conception M.D.   On: 02/15/2019 13:56   Ct Head Wo Contrast  Result Date: 02/15/2019 CLINICAL DATA:  Status post fall today and landed on the face. Regions to the forehead, nose, lips EXAM: CT HEAD WITHOUT CONTRAST CT MAXILLOFACIAL WITHOUT CONTRAST CT CERVICAL SPINE WITHOUT CONTRAST TECHNIQUE: Multidetector CT imaging of the head, cervical spine, and maxillofacial structures were performed using the standard protocol without intravenous contrast. Multiplanar CT image reconstructions of the cervical spine and maxillofacial structures were also generated. COMPARISON:  None. FINDINGS: CT HEAD FINDINGS Brain: No evidence of acute infarction, hemorrhage, extra-axial collection, ventriculomegaly, or mass effect. Generalized cerebral atrophy. Periventricular white matter low attenuation likely secondary to microangiopathy. Vascular: Cerebrovascular atherosclerotic calcifications are noted. Skull: Negative for fracture or focal lesion. Sinuses/Orbits: Visualized portions of the orbits are unremarkable. Visualized portions of the  paranasal sinuses and mastoid air cells are unremarkable. Other: None. CT  MAXILLOFACIAL FINDINGS Osseous: No mandibular fracture or dislocation. Mildly comminuted right nasal bone fracture. Nondisplaced left nasal bone fracture. Overlying soft tissue swelling. Temporomandibular joints are normal. No aggressive osseous lesion. Orbits: Negative. No traumatic or inflammatory finding. Sinuses: Paranasal sinuses are clear. Mastoid sinuses are clear. Rightward deviation of the nasal septum. Soft tissues: No fluid collection or hematoma. Soft tissue swelling over the nose. CT CERVICAL SPINE FINDINGS Alignment: Normal. Skull base and vertebrae: No acute fracture. No primary bone lesion or focal pathologic process. Soft tissues and spinal canal: No prevertebral fluid or swelling. No visible canal hematoma. Disc levels: Degenerative disease with disc height loss at C6-7 and C7-T1. Right uncovertebral degenerative changes at C6-7 and C7-T1 with foraminal stenosis. Bilateral facet arthropathy at C7-T1. Upper chest: Lung apices are clear. Other: No fluid collection or hematoma. IMPRESSION: 1. No acute intracranial pathology. 2. Mildly comminuted right nasal bone fracture. Nondisplaced left nasal bone fracture. Overlying soft tissue swelling. 3.  No acute osseous injury of the cervical spine. Electronically Signed   By: Elige Ko   On: 02/15/2019 13:44   Ct Cervical Spine Wo Contrast  Result Date: 02/15/2019 CLINICAL DATA:  Status post fall today and landed on the face. Regions to the forehead, nose, lips EXAM: CT HEAD WITHOUT CONTRAST CT MAXILLOFACIAL WITHOUT CONTRAST CT CERVICAL SPINE WITHOUT CONTRAST TECHNIQUE: Multidetector CT imaging of the head, cervical spine, and maxillofacial structures were performed using the standard protocol without intravenous contrast. Multiplanar CT image reconstructions of the cervical spine and maxillofacial structures were also generated. COMPARISON:  None. FINDINGS: CT HEAD FINDINGS  Brain: No evidence of acute infarction, hemorrhage, extra-axial collection, ventriculomegaly, or mass effect. Generalized cerebral atrophy. Periventricular white matter low attenuation likely secondary to microangiopathy. Vascular: Cerebrovascular atherosclerotic calcifications are noted. Skull: Negative for fracture or focal lesion. Sinuses/Orbits: Visualized portions of the orbits are unremarkable. Visualized portions of the paranasal sinuses and mastoid air cells are unremarkable. Other: None. CT MAXILLOFACIAL FINDINGS Osseous: No mandibular fracture or dislocation. Mildly comminuted right nasal bone fracture. Nondisplaced left nasal bone fracture. Overlying soft tissue swelling. Temporomandibular joints are normal. No aggressive osseous lesion. Orbits: Negative. No traumatic or inflammatory finding. Sinuses: Paranasal sinuses are clear. Mastoid sinuses are clear. Rightward deviation of the nasal septum. Soft tissues: No fluid collection or hematoma. Soft tissue swelling over the nose. CT CERVICAL SPINE FINDINGS Alignment: Normal. Skull base and vertebrae: No acute fracture. No primary bone lesion or focal pathologic process. Soft tissues and spinal canal: No prevertebral fluid or swelling. No visible canal hematoma. Disc levels: Degenerative disease with disc height loss at C6-7 and C7-T1. Right uncovertebral degenerative changes at C6-7 and C7-T1 with foraminal stenosis. Bilateral facet arthropathy at C7-T1. Upper chest: Lung apices are clear. Other: No fluid collection or hematoma. IMPRESSION: 1. No acute intracranial pathology. 2. Mildly comminuted right nasal bone fracture. Nondisplaced left nasal bone fracture. Overlying soft tissue swelling. 3.  No acute osseous injury of the cervical spine. Electronically Signed   By: Elige Ko   On: 02/15/2019 13:44   Ct Maxillofacial Wo Contrast  Result Date: 02/15/2019 CLINICAL DATA:  Status post fall today and landed on the face. Regions to the forehead, nose,  lips EXAM: CT HEAD WITHOUT CONTRAST CT MAXILLOFACIAL WITHOUT CONTRAST CT CERVICAL SPINE WITHOUT CONTRAST TECHNIQUE: Multidetector CT imaging of the head, cervical spine, and maxillofacial structures were performed using the standard protocol without intravenous contrast. Multiplanar CT image reconstructions of the cervical spine and maxillofacial structures were also generated. COMPARISON:  None.  FINDINGS: CT HEAD FINDINGS Brain: No evidence of acute infarction, hemorrhage, extra-axial collection, ventriculomegaly, or mass effect. Generalized cerebral atrophy. Periventricular white matter low attenuation likely secondary to microangiopathy. Vascular: Cerebrovascular atherosclerotic calcifications are noted. Skull: Negative for fracture or focal lesion. Sinuses/Orbits: Visualized portions of the orbits are unremarkable. Visualized portions of the paranasal sinuses and mastoid air cells are unremarkable. Other: None. CT MAXILLOFACIAL FINDINGS Osseous: No mandibular fracture or dislocation. Mildly comminuted right nasal bone fracture. Nondisplaced left nasal bone fracture. Overlying soft tissue swelling. Temporomandibular joints are normal. No aggressive osseous lesion. Orbits: Negative. No traumatic or inflammatory finding. Sinuses: Paranasal sinuses are clear. Mastoid sinuses are clear. Rightward deviation of the nasal septum. Soft tissues: No fluid collection or hematoma. Soft tissue swelling over the nose. CT CERVICAL SPINE FINDINGS Alignment: Normal. Skull base and vertebrae: No acute fracture. No primary bone lesion or focal pathologic process. Soft tissues and spinal canal: No prevertebral fluid or swelling. No visible canal hematoma. Disc levels: Degenerative disease with disc height loss at C6-7 and C7-T1. Right uncovertebral degenerative changes at C6-7 and C7-T1 with foraminal stenosis. Bilateral facet arthropathy at C7-T1. Upper chest: Lung apices are clear. Other: No fluid collection or hematoma.  IMPRESSION: 1. No acute intracranial pathology. 2. Mildly comminuted right nasal bone fracture. Nondisplaced left nasal bone fracture. Overlying soft tissue swelling. 3.  No acute osseous injury of the cervical spine. Electronically Signed   By: Elige KoHetal  Patel   On: 02/15/2019 13:44    Assessment and Plan:   1. Syncope 2. SSS s/p dual chamber PPM ([employer]) 3. PAF, no anticoagulation 2/ bleeding - will interrogate her PPM - she had a similar episode 2 weeks ago - maintained on lopressor - nuclear stress test 2018 without evidence of scar or reversible ischemia - previously intolerant to multaq (long QT) and flecainide  4. Anxiety/depression - medications per primary  For questions or updates, please contact [facility] Please consult [email or web address] for contact info under   Signed, Marcelino Dusterngela Nicole Duke, PA  02/16/2019 10:23 AM   The patient was seen, examined and discussed with Duane BostonAngela N Duke , PA-C and I agree with the above.   Kayla Beard is a 73 y.o. 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 Kayla Beard [facility] Her last clinic visit with them on 07/01/18 without complications.  She is intolerant to multaq with complicated long QT. OSA not on CPAP.   The patient presented with 3 episodes of syncope in the last 3 weeks, one was yesterday while walking a short distance, she denies any palpitation chest pain or shortness of breath at the time but she became unusually weak.  She had another episode in her husband's office when she bent down and fell over and another episode when she got out of the car.  There is no associated preceding symptoms, no urination during the episode.  Her orthostatics today were negative.  Physical exam shows patient in no distress, with facial bruising, right arm in a sling, S1-S2 no murmur regular, lungs are clear to  auscultation, extremities are warm and there are strong peripheral pulses bilaterally.  We have called [employer] and interrogation of her pacer shows no AV blocks no pauses, no atrial fibrillation and no other arrhythmias that could cause a syncope.  As I mentioned her orthostatics were negative.  Her blood pressure appears normal.  I would  order an echocardiogram to evaluate her LVEF as she has not had one in years, I would also recommend to consult neurology for further evaluation.  She denies any recent chest pain or worsening shortness of breath, troponin was negative, she had a negative stress test in 2019, I would not recommend to perform ischemic evaluation at this point.  Tobias Alexander, MD 02/16/2019

## 2019-02-16 NOTE — TOC Initial Note (Signed)
Transition of Care [facility](TOC) - Initial/Assessment Note    Patient Details  Name: Thornell Mulenne Esty MRN: [id number] Date of Birth: 07/19/1946  Transition of Care [facility](TOC) CM/SW Contact:    Lanier ClamMahabir, Saia Derossett, RN Phone Number: 02/16/2019, 1:23 PM  Clinical Narrative:  Await PT cons & recc.                 Expected Discharge Plan: Home w Home Health Services Barriers to Discharge: Continued Medical Work up   Patient Goals and CMS Choice        Expected Discharge Plan and Services Expected Discharge Plan: Home w Home Health Services   Discharge Planning Services: CM Consult                                          Prior Living Arrangements/Services   Lives with:: Spouse Patient language and need for interpreter reviewed:: Yes Do you feel safe going back to the place where you live?: Yes      Need for Family Participation in Patient Care: No (Comment) Care giver support system in place?: Yes (comment) Current home services: DME(cane, rw) Criminal Activity/Legal Involvement Pertinent to Current Situation/Hospitalization: No - Comment as needed  Activities of Daily Living Home Assistive Devices/Equipment: Eyeglasses ADL Screening (condition at time of admission) Patient's cognitive ability adequate to safely complete daily activities?: Yes Is the patient deaf or have difficulty hearing?: No Does the patient have difficulty seeing, even when wearing glasses/contacts?: No Does the patient have difficulty concentrating, remembering, or making decisions?: No Patient able to express need for assistance with ADLs?: Yes Does the patient have difficulty dressing or bathing?: Yes Independently performs ADLs?: No Communication: Independent Dressing (OT): Needs assistance Is this a change from baseline?: Change from baseline, expected to last >3 days Grooming: Needs assistance Is this a change from baseline?: Change from baseline, expected to last >3 days Feeding: Independent Bathing:  Needs assistance Is this a change from baseline?: Change from baseline, expected to last >3 days Toileting: Needs assistance Is this a change from baseline?: Change from baseline, expected to last >3days In/Out Bed: Needs assistance Is this a change from baseline?: Change from baseline, expected to last >3 days Walks in Home: Needs assistance Is this a change from baseline?: Change from baseline, expected to last >3 days Does the patient have difficulty walking or climbing stairs?: Yes Weakness of Legs: Both Weakness of Arms/Hands: Right  Permission Sought/Granted Permission sought to share information with : Case Manager Permission granted to share information with : Yes, Verbal Permission Granted              Emotional Assessment Appearance:: Appears stated age Attitude/Demeanor/Rapport: Gracious   Orientation: : Oriented to Self, Oriented to Place, Oriented to  Time, Oriented to Situation Alcohol / Substance Use: Never Used Psych Involvement: No (comment)  Admission diagnosis:  Fall [W19.XXXA] Closed fracture of nasal bone, initial encounter [S02.2XXA] Syncope, unspecified syncope type [R55] Closed fracture of proximal end of right humerus, unspecified fracture morphology, initial encounter [S42.201A] Patient Active Problem List   Diagnosis Date Noted  . Syncope 02/15/2019  . Fall 02/15/2019  . Closed fracture of right proximal humerus 02/15/2019  . PAF (paroxysmal atrial fibrillation) (HCC) 02/15/2019  . Pacemaker 02/15/2019  . Closed fracture of nasal bones    PCP:  Forrest Moronuehle, Stephen, MD Pharmacy:   [facility] [phone] - [place], [place] - [address]  [address] [place] [address] Phone: [phone] Fax: [phone]     Social Determinants of Health (SDOH) Interventions    Readmission Risk Interventions No flowsheet data found.

## 2019-02-16 NOTE — Consult Note (Signed)
ORTHOPAEDIC CONSULTATION  REQUESTING PHYSICIAN: Cathren Harshai, Ripudeep K, MD  PCP:  Forrest Moronuehle, Stephen, MD  Chief Complaint: R shoulder pain  HPI: Kayla Beard is a 73 y.o. female who complains of right shoulder pain after she fell onto her shoulder yesterday.  She also fractured her nose.  She had a syncopal event and was therefore [facility] for medical work-up.  She denies other injuries.  No numbness or tingling.  Past Medical History:  Diagnosis Date   Anxiety    Asthma    Depression    GERD (gastroesophageal reflux disease)    Hypercholesterolemia    Hypertension    Obsessive compulsive disorder    Past Surgical History:  Procedure Laterality Date   APPENDECTOMY     CHOLECYSTECTOMY     TONSILLECTOMY     Social History   Socioeconomic History   Marital status: Married    Spouse name: Not on file   Number of children: Not on file   Years of education: Not on file   Highest education level: Not on file  Occupational History   Not on file  Social Needs   Financial resource strain: Not on file   Food insecurity    Worry: Not on file    Inability: Not on file   Transportation needs    Medical: Not on file    Non-medical: Not on file  Tobacco Use   Smoking status: Never Smoker   Smokeless tobacco: Never Used  Substance and Sexual Activity   Alcohol use: No   Drug use: No   Sexual activity: Not on file  Lifestyle   Physical activity    Days per week: Not on file    Minutes per session: Not on file   Stress: Not on file  Relationships   Social connections    Talks on phone: Not on file    Gets together: Not on file    Attends religious service: Not on file    Active member of club or organization: Not on file    Attends meetings of clubs or organizations: Not on file    Relationship status: Not on file  Other Topics Concern   Not on file  Social History Narrative   Not on file   Family History  Family history  unknown: Yes   Allergies  Allergen Reactions   Hydroxyzine     Other reaction(s): Other (See Comments) Sleepiness Nightmares    Prednisone Anxiety    Other reaction(s): Other (See Comments)   Rivaroxaban Other (See Comments)    Bleeding Bleeding    Doxepin    Levofloxacin Diarrhea   Metformin And Related Diarrhea   Prior to Admission medications   Medication Sig Start Date End Date Taking? Authorizing Provider  ALPRAZolam Prudy Feeler(XANAX) 1 MG tablet Take 0.5 mg by mouth 3 (three) times daily. 01/29/19  Yes [provider]  busPIRone (BUSPAR) 15 MG tablet Take 15 mg by mouth 3 (three) times daily. 12/02/18  Yes [provider]  metoprolol (TOPROL-XL) 50 MG 24 hr tablet Take 50 mg by mouth daily.     Yes [provider]  rosuvastatin (CRESTOR) 10 MG tablet Take 10 mg by mouth daily. 11/19/18  Yes [provider]  lansoprazole (PREVACID SOLUTAB) 30 MG disintegrating tablet Take 1 tablet (30 mg total) by mouth daily. 03/12/11 03/11/12  Linwood DibblesKnapp, Jon, MD  sucralfate (CARAFATE) 1 G tablet Take 1 tablet (1 g total) by mouth 4 (four) times daily. 03/12/11  03/11/12  Linwood DibblesKnapp, Jon, MD   Dg Chest 1 View  Result Date: 02/15/2019 CLINICAL DATA:  Fall. EXAM: CHEST  1 VIEW COMPARISON:  Radiographs of June 13, 2018. FINDINGS: The heart size and mediastinal contours are within normal limits. No pneumothorax or pleural effusion is noted. Left-sided pacemaker is unchanged in position. Both lungs are clear. Mildly displaced proximal right humeral neck fracture is noted. IMPRESSION: No acute cardiopulmonary abnormality seen. Mildly displaced proximal right humeral neck fracture. Electronically Signed   By: Lupita RaiderJames  Green Jr M.D.   On: 02/15/2019 13:55   Dg Shoulder Right  Result Date: 02/15/2019 CLINICAL DATA:  Right shoulder pain after fall today. EXAM: RIGHT SHOULDER - 2+ VIEW COMPARISON:  None. FINDINGS: Mildly displaced proximal right humeral neck fracture is noted. Severe  degenerative changes seen involving the right glenohumeral joint. Visualized ribs are unremarkable. IMPRESSION: Mildly displaced proximal right humeral neck fracture. Electronically Signed   By: Lupita RaiderJames  Green Jr M.D.   On: 02/15/2019 13:56   Ct Head Wo Contrast  Result Date: 02/15/2019 CLINICAL DATA:  Status post fall today and landed on the face. Regions to the forehead, nose, lips EXAM: CT HEAD WITHOUT CONTRAST CT MAXILLOFACIAL WITHOUT CONTRAST CT CERVICAL SPINE WITHOUT CONTRAST TECHNIQUE: Multidetector CT imaging of the head, cervical spine, and maxillofacial structures were performed using the standard protocol without intravenous contrast. Multiplanar CT image reconstructions of the cervical spine and maxillofacial structures were also generated. COMPARISON:  None. FINDINGS: CT HEAD FINDINGS Brain: No evidence of acute infarction, hemorrhage, extra-axial collection, ventriculomegaly, or mass effect. Generalized cerebral atrophy. Periventricular white matter low attenuation likely secondary to microangiopathy. Vascular: Cerebrovascular atherosclerotic calcifications are noted. Skull: Negative for fracture or focal lesion. Sinuses/Orbits: Visualized portions of the orbits are unremarkable. Visualized portions of the paranasal sinuses and mastoid air cells are unremarkable. Other: None. CT MAXILLOFACIAL FINDINGS Osseous: No mandibular fracture or dislocation. Mildly comminuted right nasal bone fracture. Nondisplaced left nasal bone fracture. Overlying soft tissue swelling. Temporomandibular joints are normal. No aggressive osseous lesion. Orbits: Negative. No traumatic or inflammatory finding. Sinuses: Paranasal sinuses are clear. Mastoid sinuses are clear. Rightward deviation of the nasal septum. Soft tissues: No fluid collection or hematoma. Soft tissue swelling over the nose. CT CERVICAL SPINE FINDINGS Alignment: Normal. Skull base and vertebrae: No acute fracture. No primary bone lesion or focal pathologic  process. Soft tissues and spinal canal: No prevertebral fluid or swelling. No visible canal hematoma. Disc levels: Degenerative disease with disc height loss at C6-7 and C7-T1. Right uncovertebral degenerative changes at C6-7 and C7-T1 with foraminal stenosis. Bilateral facet arthropathy at C7-T1. Upper chest: Lung apices are clear. Other: No fluid collection or hematoma. IMPRESSION: 1. No acute intracranial pathology. 2. Mildly comminuted right nasal bone fracture. Nondisplaced left nasal bone fracture. Overlying soft tissue swelling. 3.  No acute osseous injury of the cervical spine. Electronically Signed   By: Elige KoHetal  Patel   On: 02/15/2019 13:44   Ct Cervical Spine Wo Contrast  Result Date: 02/15/2019 CLINICAL DATA:  Status post fall today and landed on the face. Regions to the forehead, nose, lips EXAM: CT HEAD WITHOUT CONTRAST CT MAXILLOFACIAL WITHOUT CONTRAST CT CERVICAL SPINE WITHOUT CONTRAST TECHNIQUE: Multidetector CT imaging of the head, cervical spine, and maxillofacial structures were performed using the standard protocol without intravenous contrast. Multiplanar CT image reconstructions of the cervical spine and maxillofacial structures were also generated. COMPARISON:  None. FINDINGS: CT HEAD FINDINGS Brain: No evidence of acute infarction, hemorrhage, extra-axial collection, ventriculomegaly, or mass effect.  Generalized cerebral atrophy. Periventricular white matter low attenuation likely secondary to microangiopathy. Vascular: Cerebrovascular atherosclerotic calcifications are noted. Skull: Negative for fracture or focal lesion. Sinuses/Orbits: Visualized portions of the orbits are unremarkable. Visualized portions of the paranasal sinuses and mastoid air cells are unremarkable. Other: None. CT MAXILLOFACIAL FINDINGS Osseous: No mandibular fracture or dislocation. Mildly comminuted right nasal bone fracture. Nondisplaced left nasal bone fracture. Overlying soft tissue swelling. Temporomandibular  joints are normal. No aggressive osseous lesion. Orbits: Negative. No traumatic or inflammatory finding. Sinuses: Paranasal sinuses are clear. Mastoid sinuses are clear. Rightward deviation of the nasal septum. Soft tissues: No fluid collection or hematoma. Soft tissue swelling over the nose. CT CERVICAL SPINE FINDINGS Alignment: Normal. Skull base and vertebrae: No acute fracture. No primary bone lesion or focal pathologic process. Soft tissues and spinal canal: No prevertebral fluid or swelling. No visible canal hematoma. Disc levels: Degenerative disease with disc height loss at C6-7 and C7-T1. Right uncovertebral degenerative changes at C6-7 and C7-T1 with foraminal stenosis. Bilateral facet arthropathy at C7-T1. Upper chest: Lung apices are clear. Other: No fluid collection or hematoma. IMPRESSION: 1. No acute intracranial pathology. 2. Mildly comminuted right nasal bone fracture. Nondisplaced left nasal bone fracture. Overlying soft tissue swelling. 3.  No acute osseous injury of the cervical spine. Electronically Signed   By: Kathreen Devoid   On: 02/15/2019 13:44   Ct Maxillofacial Wo Contrast  Result Date: 02/15/2019 CLINICAL DATA:  Status post fall today and landed on the face. Regions to the forehead, nose, lips EXAM: CT HEAD WITHOUT CONTRAST CT MAXILLOFACIAL WITHOUT CONTRAST CT CERVICAL SPINE WITHOUT CONTRAST TECHNIQUE: Multidetector CT imaging of the head, cervical spine, and maxillofacial structures were performed using the standard protocol without intravenous contrast. Multiplanar CT image reconstructions of the cervical spine and maxillofacial structures were also generated. COMPARISON:  None. FINDINGS: CT HEAD FINDINGS Brain: No evidence of acute infarction, hemorrhage, extra-axial collection, ventriculomegaly, or mass effect. Generalized cerebral atrophy. Periventricular white matter low attenuation likely secondary to microangiopathy. Vascular: Cerebrovascular atherosclerotic calcifications are  noted. Skull: Negative for fracture or focal lesion. Sinuses/Orbits: Visualized portions of the orbits are unremarkable. Visualized portions of the paranasal sinuses and mastoid air cells are unremarkable. Other: None. CT MAXILLOFACIAL FINDINGS Osseous: No mandibular fracture or dislocation. Mildly comminuted right nasal bone fracture. Nondisplaced left nasal bone fracture. Overlying soft tissue swelling. Temporomandibular joints are normal. No aggressive osseous lesion. Orbits: Negative. No traumatic or inflammatory finding. Sinuses: Paranasal sinuses are clear. Mastoid sinuses are clear. Rightward deviation of the nasal septum. Soft tissues: No fluid collection or hematoma. Soft tissue swelling over the nose. CT CERVICAL SPINE FINDINGS Alignment: Normal. Skull base and vertebrae: No acute fracture. No primary bone lesion or focal pathologic process. Soft tissues and spinal canal: No prevertebral fluid or swelling. No visible canal hematoma. Disc levels: Degenerative disease with disc height loss at C6-7 and C7-T1. Right uncovertebral degenerative changes at C6-7 and C7-T1 with foraminal stenosis. Bilateral facet arthropathy at C7-T1. Upper chest: Lung apices are clear. Other: No fluid collection or hematoma. IMPRESSION: 1. No acute intracranial pathology. 2. Mildly comminuted right nasal bone fracture. Nondisplaced left nasal bone fracture. Overlying soft tissue swelling. 3.  No acute osseous injury of the cervical spine. Electronically Signed   By: Kathreen Devoid   On: 02/15/2019 13:44    Positive ROS: All other systems have been reviewed and were otherwise negative with the exception of those mentioned in the HPI and as above.  Physical Exam: General: Alert, no  acute distress Cardiovascular: No pedal edema Respiratory: No cyanosis, no use of accessory musculature GI: No organomegaly, abdomen is soft and non-tender Skin: No lesions in the area of chief complaint Neurologic: Sensation intact  distally Psychiatric: Patient is competent for consent with normal mood and affect Lymphatic: No axillary or cervical lymphadenopathy  MUSCULOSKELETAL: Examination of the right shoulder reveals no skin wounds or lesions.  Small amount of ecchymosis.  She is neurovascularly intact distally.  Assessment: Right proximal humerus fracture, minimally displaced.  Plan: I discussed the findings with the patient.  I recommend sling, nonweightbearing right upper extremity.  Follow-up with Dr. Aundria Rudogers in 1 week for repeat imaging.  All questions solicited and answered.    Kayla PesaBrian J Naysha Sholl, MD Cell [phone]    02/16/2019 1:18 PM

## 2019-02-17 ENCOUNTER — Inpatient Hospital Stay (HOSPITAL_COMMUNITY): Payer: Medicare HMO

## 2019-02-17 MED ORDER — ACETAMINOPHEN 325 MG PO TABS: 650.0000 mg | ORAL_TABLET | Freq: Four times a day (QID) | ORAL | 3 refills | Status: AC | PRN

## 2019-02-17 NOTE — Evaluation (Signed)
Physical Therapy Evaluation Patient Details Name: Kayla Beard MRN: [id number] DOB: [birth year] Today's Date: 02/17/2019   History of Present Illness  73 y.o. female who complains of right shoulder pain after she fell onto her shoulder yesterday.  She also fractured her nose.  She had a syncopal event and was therefore admitted to [facility] long for medical work-up.  Clinical Impression  Patient evaluated by Physical Therapy with no further acute PT needs identified. All education has been completed and the patient has no further questions.   Pt with unsteady gait (reports at least 3 falls in the last month), will need HHPT and assist with mobility.   See below for any follow-up Physical Therapy or equipment needs. PT is signing off. Thank you for this referral.     Follow Up Recommendations Home health PT;Supervision for mobility/OOB    Equipment Recommendations  None recommended by PT    Recommendations for Other Services       Precautions / Restrictions Precautions Precautions: Fall Required Braces or Orthoses: Sling Restrictions Weight Bearing Restrictions: Yes RUE Weight Bearing: Non weight bearing      Mobility  Bed Mobility Overal bed mobility: Needs Assistance Bed Mobility: Supine to Sit;Sit to Supine     Supine to sit: Min assist;Mod assist Sit to supine: Min assist;Mod assist   General bed mobility comments: assist for trunk to sit up and assist for second leg when getting back into bed.  Transfers Overall transfer level: Needs assistance Equipment used: None Transfers: Sit to/from Stand Sit to Stand: Min assist Stand pivot transfers: Min assist       General transfer comment: min A to transition to standing  Ambulation/Gait Ambulation/Gait assistance: Min assist;Min guard Gait Distance (Feet): 40 Feet Assistive device: Quad cane Gait Pattern/deviations: Step-to pattern Gait velocity: decr   General Gait Details: overall unsteady gait but without overt  LOB, balance improved with distance, verbal cues for safe use and quad cane technique  Stairs            Wheelchair Mobility    Modified Rankin (Stroke Patients Only)       Balance Overall balance assessment: Needs assistance Sitting-balance support: No upper extremity supported;Feet unsupported Sitting balance-Leahy Scale: Fair       Standing balance-Leahy Scale: Poor Standing balance comment: reliant on unilateral support for safe static stand                             Pertinent Vitals/Pain Pain Assessment: Faces Faces Pain Scale: Hurts little more Pain Location: right shoulder Pain Descriptors / Indicators: Sore Pain Intervention(s): Limited activity within patient's tolerance;Monitored during session    Home Living Family/patient expects to be discharged to:: Private residence   Available Help at Discharge: Family Type of Home: House Home Access: Stairs to enter Entrance Stairs-Rails: Right Entrance Stairs-Number of Steps: 3 and 3 more (rail for second 3 ) Home Layout: One level Home Equipment: Walker - 2 wheels;Cane - quad Additional Comments: has standard toilet and walk in shower with a seat    Prior Function Level of Independence: Independent               Hand Dominance        Extremity/Trunk Assessment   Upper Extremity Assessment Upper Extremity Assessment: RUE deficits/detail;Generalized weakness(immoblized; able to move fingers)    Lower Extremity Assessment Lower Extremity Assessment: Generalized weakness       Communication   Communication: No difficulties  Cognition Arousal/Alertness: Awake/alert Behavior During Therapy: WFL for tasks assessed/performed   Area of Impairment: Memory                     Memory: Decreased short-term memory         General Comments: pt does not recall OT session although appropriate and follows commands consistently      General Comments      Exercises      Assessment/Plan    PT Assessment All further PT needs can be met in the next venue of care  PT Problem List Decreased strength;Decreased activity tolerance;Decreased balance;Pain;Decreased knowledge of use of DME;Decreased mobility;Decreased safety awareness;Decreased knowledge of precautions       PT Treatment Interventions      PT Goals (Current goals can be found in the Care Plan section)  Acute Rehab PT Goals Patient Stated Goal: home PT Goal Formulation: All assessment and education complete, DC therapy    Frequency     Barriers to discharge        Co-evaluation               AM-PAC PT "6 Clicks" Mobility  Outcome Measure Help needed turning from your back to your side while in a flat bed without using bedrails?: A Lot Help needed moving from lying on your back to sitting on the side of a flat bed without using bedrails?: A Lot Help needed moving to and from a bed to a chair (including a wheelchair)?: A Little Help needed standing up from a chair using your arms (e.g., wheelchair or bedside chair)?: A Little Help needed to walk in hospital room?: A Little Help needed climbing 3-5 steps with a railing? : A Lot 6 Click Score: 15    End of Session Equipment Utilized During Treatment: Gait belt Activity Tolerance: Patient tolerated treatment well Patient left: in bed;with call bell/phone within reach;with bed alarm set Nurse Communication: Mobility status PT Visit Diagnosis: Unsteadiness on feet (R26.81);Repeated falls (R29.6)    Time: 0375-4360 PT Time Calculation (min) (ACUTE ONLY): 28 min   Charges:   PT Evaluation $PT Eval Low Complexity: 1 Low PT Treatments $Gait Training: 8-22 mins        Kenyon Ana, PT  Pager: [phone] Acute Rehab Dept [facility]): [phone]   02/17/2019   [facility] 02/17/2019, 2:25 PM

## 2019-02-17 NOTE — Progress Notes (Signed)
Pt will discharge home with [facility] for [facility], referral has been given to in house rep.

## 2019-02-17 NOTE — Progress Notes (Signed)
I have reviewed xrays for right proximal humerus.  Will plan for non op treatment and follow up with me in office in 1-2 weeks.  Sling for now and NWB>

## 2019-02-17 NOTE — Discharge Summary (Signed)
Physician Discharge Summary   Patient ID: Kayla Beard MRN: [id number] DOB/AGE: 71-02-1946 71 y.o.  Admit date: 02/15/2019 Discharge date: 02/17/2019  Primary Care Physician:  Forrest Moron, MD   Recommendations for Outpatient Follow-up:  Follow up with PCP in 1-2 weeks Recommend outpatient follow-up with neurology Continue right shoulder sling and outpatient follow-up with Dr. Linna Caprice in 2 weeks  Home Health: Home health PT OT Equipment/Devices:   Discharge Condition: stable CODE STATUS: FULL Diet recommendation: Heart healthy diet   Discharge Diagnoses:       . Syncope possibly vasovagal  . PAF (paroxysmal atrial fibrillation) (HCC) . Pacemaker Mechanical falls Closed fracture of right proximal humerus Closed fracture of nasal bones  Consults: Cardiology ENT via phone consultation    Allergies:   Allergies  Allergen Reactions  . Hydroxyzine     Other reaction(s): Other (See Comments) Sleepiness Nightmares   . Prednisone Anxiety    Other reaction(s): Other (See Comments)  . Rivaroxaban Other (See Comments)    Bleeding Bleeding   . Doxepin   . Levofloxacin Diarrhea  . Metformin And Related Diarrhea     DISCHARGE MEDICATIONS: Allergies as of 02/17/2019      Reactions   Hydroxyzine    Other reaction(s): Other (See Comments) Sleepiness Nightmares   Prednisone Anxiety   Other reaction(s): Other (See Comments)   Rivaroxaban Other (See Comments)   Bleeding Bleeding   Doxepin    Levofloxacin Diarrhea   Metformin And Related Diarrhea      Medication List    STOP taking these medications   sucralfate 1 g tablet Commonly known as: Carafate     TAKE these medications   acetaminophen 325 MG tablet Commonly known as: TYLENOL Take 2 tablets (650 mg total) by mouth every 6 (six) hours as needed for mild pain or moderate pain.   ALPRAZolam 1 MG tablet Commonly known as: XANAX Take 0.5 mg by mouth 3 (three) times daily.   busPIRone 15 MG  tablet Commonly known as: BUSPAR Take 15 mg by mouth 3 (three) times daily.   lansoprazole 30 MG disintegrating tablet Commonly known as: Prevacid SoluTab Take 1 tablet (30 mg total) by mouth daily.   metoprolol succinate 50 MG 24 hr tablet Commonly known as: TOPROL-XL Take 50 mg by mouth daily.   rosuvastatin 10 MG tablet Commonly known as: CRESTOR Take 10 mg by mouth daily.        Brief H and P: For complete details please refer to admission H and P, but in brief Patient is a 73 year old female with history of tachybradycardia syndrome, status post pacemaker, paroxysmal A. fib had a syncopal episode at home while walking.  Patient reports that over the last few months, she has been having falls due to legs giving up.  She had followed up with neurology outpatient who felt her symptoms were due to neuropathy.  Patient was walking on the driveway to the road, her legs gave way and she fell onto the ground and hit her face. In ED, CT head Facial and C-spine showed nasal bone fracture with small hematoma.  Right shoulder x-ray showed right humerus fracture. Orthopedics was consulted, recommended sling for the right humerus fracture and follow outpatient.  Hospital Course:    Syncope: Possibly vasovagal syncope -Patient had mild dizziness prior to the fall however she has been having recurrent falls. Patient has followed with neurology outpatient, was attributed to neuropathy. -CK 43, troponins negative for ACS -2D echo showed EF of 60 to 65%,  normal right ventricular systolic function, no regional wall motion abnormalities. -Orthostatic vitals negative, UA showed ketones, possibly symptoms due to dehydration -Patient has a pacemaker, per cardiology, working okay no arrhythmias noted     Falls -PT evaluation recommended home health PT, arranged by case management    Closed fracture of right proximal humerus -Orthopedics was consulted, recommended right shoulder sling and  outpatient follow-up -Continue shoulder sling, outpatient follow-up with Dr. Aundria Rudogers in 2 weeks -X-rays of the hand and right wrist negative for any fractures    PAF (paroxysmal atrial fibrillation) (HCC) -Currently rate controlled, has dual-chamber pacemaker -No arrhythmias on pacemaker interrogation -No anticoagulation secondary to bleeding    Closed fracture of nasal bones CT maxillofacial showed mildly comminuted right nasal bone fracture, nondisplaced left nasal bone fracture, overlying soft tissue swelling - d/w Dr. Lazarus SalinesWolicki, ENT, recommended no intervention while it is swollen.  Recommended ice pack, will see patient in the office in 1 week once the swelling is down.  Likely will not need any surgical intervention.   Day of Discharge S: No acute complaints, no chest pain no dizziness or lightheadedness.  Does not want any skilled nursing facility.  BP 129/75 (BP Location: Left Arm)   Pulse 83   Temp 97.8 F (36.6 C) (Oral)   Resp 16   Ht 5' (1.524 m)   Wt 74.4 kg   SpO2 96%   BMI 32.03 kg/m   Physical Exam: General: Alert and awake oriented x3 not in any acute distress. HEENT: anicteric sclera, pupils reactive to light and accommodation CVS: S1-S2 clear no murmur rubs or gallops Chest: clear to auscultation bilaterally, no wheezing rales or rhonchi Abdomen: soft nontender, nondistended, normal bowel sounds Extremities: Right arm in sling Neuro: Cranial nerves II-XII intact, no focal neurological deficits   The results of significant diagnostics from this hospitalization (including imaging, microbiology, ancillary and laboratory) are listed below for reference.      Procedures/Studies:  Dg Chest 1 View  Result Date: 02/15/2019 CLINICAL DATA:  Fall. EXAM: CHEST  1 VIEW COMPARISON:  Radiographs of June 13, 2018. FINDINGS: The heart size and mediastinal contours are within normal limits. No pneumothorax or pleural effusion is noted. Left-sided pacemaker is  unchanged in position. Both lungs are clear. Mildly displaced proximal right humeral neck fracture is noted. IMPRESSION: No acute cardiopulmonary abnormality seen. Mildly displaced proximal right humeral neck fracture. Electronically Signed   By: Lupita RaiderJames  Green Jr M.D.   On: 02/15/2019 13:55   Dg Shoulder Right  Result Date: 02/15/2019 CLINICAL DATA:  Right shoulder pain after fall today. EXAM: RIGHT SHOULDER - 2+ VIEW COMPARISON:  None. FINDINGS: Mildly displaced proximal right humeral neck fracture is noted. Severe degenerative changes seen involving the right glenohumeral joint. Visualized ribs are unremarkable. IMPRESSION: Mildly displaced proximal right humeral neck fracture. Electronically Signed   By: Lupita RaiderJames  Green Jr M.D.   On: 02/15/2019 13:56   Dg Wrist Complete Right  Result Date: 02/17/2019 CLINICAL DATA:  Right hand and wrist pain.  Recent fall. EXAM: RIGHT WRIST - COMPLETE 3+ VIEW; RIGHT HAND - 2 VIEW COMPARISON:  None. FINDINGS: No acute fracture or dislocation. 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 Remaining joint spaces are preserved. Bone mineralization is normal. Soft tissues are unremarkable. IMPRESSION: 1. No acute osseous abnormality of the right hand and wrist. Electronically Signed   By: Obie DredgeWilliam T Derry M.D.   On: 02/17/2019 09:32   Ct Head Wo Contrast  Result  Date: 02/15/2019 CLINICAL DATA:  Status post fall today and landed on the face. Regions to the forehead, nose, lips EXAM: CT HEAD WITHOUT CONTRAST CT MAXILLOFACIAL WITHOUT CONTRAST CT CERVICAL SPINE WITHOUT CONTRAST TECHNIQUE: Multidetector CT imaging of the head, cervical spine, and maxillofacial structures were performed using the standard protocol without intravenous contrast. Multiplanar CT image reconstructions of the cervical spine and maxillofacial structures were also generated. COMPARISON:  None. FINDINGS: CT HEAD FINDINGS Brain: No evidence of acute infarction,  hemorrhage, extra-axial collection, ventriculomegaly, or mass effect. Generalized cerebral atrophy. Periventricular white matter low attenuation likely secondary to microangiopathy. Vascular: Cerebrovascular atherosclerotic calcifications are noted. Skull: Negative for fracture or focal lesion. Sinuses/Orbits: Visualized portions of the orbits are unremarkable. Visualized portions of the paranasal sinuses and mastoid air cells are unremarkable. Other: None. CT MAXILLOFACIAL FINDINGS Osseous: No mandibular fracture or dislocation. Mildly comminuted right nasal bone fracture. Nondisplaced left nasal bone fracture. Overlying soft tissue swelling. Temporomandibular joints are normal. No aggressive osseous lesion. Orbits: Negative. No traumatic or inflammatory finding. Sinuses: Paranasal sinuses are clear. Mastoid sinuses are clear. Rightward deviation of the nasal septum. Soft tissues: No fluid collection or hematoma. Soft tissue swelling over the nose. CT CERVICAL SPINE FINDINGS Alignment: Normal. Skull base and vertebrae: No acute fracture. No primary bone lesion or focal pathologic process. Soft tissues and spinal canal: No prevertebral fluid or swelling. No visible canal hematoma. Disc levels: Degenerative disease with disc height loss at C6-7 and C7-T1. Right uncovertebral degenerative changes at C6-7 and C7-T1 with foraminal stenosis. Bilateral facet arthropathy at C7-T1. Upper chest: Lung apices are clear. Other: No fluid collection or hematoma. IMPRESSION: 1. No acute intracranial pathology. 2. Mildly comminuted right nasal bone fracture. Nondisplaced left nasal bone fracture. Overlying soft tissue swelling. 3.  No acute osseous injury of the cervical spine. Electronically Signed   By: Elige KoHetal  Patel   On: 02/15/2019 13:44   Ct Cervical Spine Wo Contrast  Result Date: 02/15/2019 CLINICAL DATA:  Status post fall today and landed on the face. Regions to the forehead, nose, lips EXAM: CT HEAD WITHOUT CONTRAST CT  MAXILLOFACIAL WITHOUT CONTRAST CT CERVICAL SPINE WITHOUT CONTRAST TECHNIQUE: Multidetector CT imaging of the head, cervical spine, and maxillofacial structures were performed using the standard protocol without intravenous contrast. Multiplanar CT image reconstructions of the cervical spine and maxillofacial structures were also generated. COMPARISON:  None. FINDINGS: CT HEAD FINDINGS Brain: No evidence of acute infarction, hemorrhage, extra-axial collection, ventriculomegaly, or mass effect. Generalized cerebral atrophy. Periventricular white matter low attenuation likely secondary to microangiopathy. Vascular: Cerebrovascular atherosclerotic calcifications are noted. Skull: Negative for fracture or focal lesion. Sinuses/Orbits: Visualized portions of the orbits are unremarkable. Visualized portions of the paranasal sinuses and mastoid air cells are unremarkable. Other: None. CT MAXILLOFACIAL FINDINGS Osseous: No mandibular fracture or dislocation. Mildly comminuted right nasal bone fracture. Nondisplaced left nasal bone fracture. Overlying soft tissue swelling. Temporomandibular joints are normal. No aggressive osseous lesion. Orbits: Negative. No traumatic or inflammatory finding. Sinuses: Paranasal sinuses are clear. Mastoid sinuses are clear. Rightward deviation of the nasal septum. Soft tissues: No fluid collection or hematoma. Soft tissue swelling over the nose. CT CERVICAL SPINE FINDINGS Alignment: Normal. Skull base and vertebrae: No acute fracture. No primary bone lesion or focal pathologic process. Soft tissues and spinal canal: No prevertebral fluid or swelling. No visible canal hematoma. Disc levels: Degenerative disease with disc height loss at C6-7 and C7-T1. Right uncovertebral degenerative changes at C6-7 and C7-T1 with foraminal stenosis. Bilateral facet arthropathy at C7-T1.  Upper chest: Lung apices are clear. Other: No fluid collection or hematoma. IMPRESSION: 1. No acute intracranial pathology.  2. Mildly comminuted right nasal bone fracture. Nondisplaced left nasal bone fracture. Overlying soft tissue swelling. 3.  No acute osseous injury of the cervical spine. Electronically Signed   By: Elige KoHetal  Patel   On: 02/15/2019 13:44   Dg Hand 2 View Right  Result Date: 02/17/2019 CLINICAL DATA:  Right hand and wrist pain.  Recent fall. EXAM: RIGHT WRIST - COMPLETE 3+ VIEW; RIGHT HAND - 2 VIEW COMPARISON:  None. FINDINGS: No acute fracture or dislocation. 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 Remaining joint spaces are preserved. Bone mineralization is normal. Soft tissues are unremarkable. IMPRESSION: 1. No acute osseous abnormality of the right hand and wrist. Electronically Signed   By: Obie DredgeWilliam T Derry M.D.   On: 02/17/2019 09:32   Ct Maxillofacial Wo Contrast  Result Date: 02/15/2019 CLINICAL DATA:  Status post fall today and landed on the face. Regions to the forehead, nose, lips EXAM: CT HEAD WITHOUT CONTRAST CT MAXILLOFACIAL WITHOUT CONTRAST CT CERVICAL SPINE WITHOUT CONTRAST TECHNIQUE: Multidetector CT imaging of the head, cervical spine, and maxillofacial structures were performed using the standard protocol without intravenous contrast. Multiplanar CT image reconstructions of the cervical spine and maxillofacial structures were also generated. COMPARISON:  None. FINDINGS: CT HEAD FINDINGS Brain: No evidence of acute infarction, hemorrhage, extra-axial collection, ventriculomegaly, or mass effect. Generalized cerebral atrophy. Periventricular white matter low attenuation likely secondary to microangiopathy. Vascular: Cerebrovascular atherosclerotic calcifications are noted. Skull: Negative for fracture or focal lesion. Sinuses/Orbits: Visualized portions of the orbits are unremarkable. Visualized portions of the paranasal sinuses and mastoid air cells are unremarkable. Other: None. CT MAXILLOFACIAL FINDINGS Osseous: No mandibular fracture or  dislocation. Mildly comminuted right nasal bone fracture. Nondisplaced left nasal bone fracture. Overlying soft tissue swelling. Temporomandibular joints are normal. No aggressive osseous lesion. Orbits: Negative. No traumatic or inflammatory finding. Sinuses: Paranasal sinuses are clear. Mastoid sinuses are clear. Rightward deviation of the nasal septum. Soft tissues: No fluid collection or hematoma. Soft tissue swelling over the nose. CT CERVICAL SPINE FINDINGS Alignment: Normal. Skull base and vertebrae: No acute fracture. No primary bone lesion or focal pathologic process. Soft tissues and spinal canal: No prevertebral fluid or swelling. No visible canal hematoma. Disc levels: Degenerative disease with disc height loss at C6-7 and C7-T1. Right uncovertebral degenerative changes at C6-7 and C7-T1 with foraminal stenosis. Bilateral facet arthropathy at C7-T1. Upper chest: Lung apices are clear. Other: No fluid collection or hematoma. IMPRESSION: 1. No acute intracranial pathology. 2. Mildly comminuted right nasal bone fracture. Nondisplaced left nasal bone fracture. Overlying soft tissue swelling. 3.  No acute osseous injury of the cervical spine. Electronically Signed   By: Elige KoHetal  Patel   On: 02/15/2019 13:44      LAB RESULTS: Basic Metabolic Panel: Recent Labs  Lab 02/15/19 1251 02/16/19 0553  NA 137 136  K 3.5 3.7  CL 103 104  CO2 22 22  GLUCOSE 129* 128*  BUN 10 12  CREATININE 0.92 0.69  CALCIUM 8.4* 8.1*   Liver Function Tests: Recent Labs  Lab 02/15/19 1251  AST 42*  ALT 41  ALKPHOS 63  BILITOT 0.9  PROT 6.7  ALBUMIN 3.6   No results for input(s): LIPASE, AMYLASE in the last 168 hours. No results for input(s): AMMONIA in the last 168 hours. CBC: Recent Labs  Lab 02/15/19 1251 02/16/19 0553  WBC 7.7 14.0*  HGB 12.0 11.9*  HCT 36.2 37.1  MCV 91.0 95.1  PLT 232 227   Cardiac Enzymes: Recent Labs  Lab 02/15/19 2030  CKTOTAL 43   BNP: Invalid input(s):  POCBNP CBG: No results for input(s): GLUCAP in the last 168 hours.    Disposition and Follow-up: Discharge Instructions    Diet - low sodium heart healthy   Complete by: As directed    Increase activity slowly   Complete by: As directed        DISPOSITION: Home with home health PT OT   [place], Jason Patrick, MD. Schedule an appointment as soon as possible for a visit in 1 week(s).   Specialty: Orthopedic Surgery Contact information: [address] [place] [address] [phone]        Jodi Marble, MD. Schedule an appointment as soon as possible for a visit in 1 week(s).   Specialty: Otolaryngology Contact information: [address] [place] [address] [phone]        Charleston Poot, MD. Schedule an appointment as soon as possible for a visit in 2 week(s).   Specialty: Internal Medicine Contact information: [address] [place] [address] [phone]            Time coordinating discharge:  35 minutes  Signed:   Estill Cotta M.D. [facility] 02/17/2019, 3:15 PM

## 2019-02-17 NOTE — Care Management Important Message (Signed)
Important Message  Patient Details  Name: Kayla Beard MRN: [id number] Date of Birth: 1945/12/11   Medicare Important Message Given:  Yes. CMA printed out the patient's IM for the CSW or Case Manager Nurse to give to the patient.      Jamarr Treinen 02/17/2019, 8:14 AM

## 2019-02-17 NOTE — Plan of Care (Signed)

## 2019-02-17 NOTE — Progress Notes (Signed)
Progress Note  Patient Name: Kayla Beard Date of Encounter: 02/17/2019  Primary Cardiologist:[facility]  Subjective   Patient denies CP  Breathing is OK  Arm sore    Inpatient Medications    Scheduled Meds:  ALPRAZolam  0.5 mg Oral TID   busPIRone  15 mg Oral TID   metoprolol succinate  50 mg Oral Daily   rosuvastatin  10 mg Oral Daily   Continuous Infusions:  PRN Meds: acetaminophen **OR** acetaminophen, ondansetron **OR** ondansetron (ZOFRAN) IV   Vital Signs    Vitals:   02/16/19 0539 02/16/19 1159 02/16/19 2051 02/17/19 0451  BP: 114/71 116/69 128/72 131/83  Pulse: 90 80 79 76  Resp:  18 16 16   Temp:  98.5 F (36.9 C) 98.6 F (37 C) 98.1 F (36.7 C)  TempSrc:  Oral Oral Oral  SpO2:  93% 93% 91%  Weight:      Height:        Intake/Output Summary (Last 24 hours) at 02/17/2019 0757 Last data filed at 02/17/2019 0409 Gross per 24 hour  Intake 494.08 ml  Output 1600 ml  Net -1105.92 ml   Last 3 Weights 02/15/2019 03/12/2011  Weight (lbs) 164 lb 180 lb  Weight (kg) 74.39 kg 81.647 kg      Telemetry    SR - Personally Reviewed  ECG    Not done   - Personally Reviewed  Physical Exam   GEN: No acute distress.   Neck: No JVD Cardiac: RRR, no murmurs, rubs, or gallops.  Respiratory: Clear to auscultation bilaterally. GI: Soft, nontender, non-distended  MS:  Tr edema  Arm in sling Neuro:  Nonfocal  Psych: Normal affect   Labs    High Sensitivity Troponin:   Recent Labs  Lab 02/15/19 2030 02/15/19 2231  TROPONINIHS <2 <2.00      Cardiac EnzymesNo results for input(s): TROPONINI in the last 168 hours. No results for input(s): TROPIPOC in the last 168 hours.   Chemistry Recent Labs  Lab 02/15/19 1251 02/16/19 0553  NA 137 136  K 3.5 3.7  CL 103 104  CO2 22 22  GLUCOSE 129* 128*  BUN 10 12  CREATININE 0.92 0.69  CALCIUM 8.4* 8.1*  PROT 6.7  --   ALBUMIN 3.6  --   AST 42*  --   ALT 41  --   ALKPHOS 63  --   BILITOT 0.9  --     GFRNONAA >60 >60  GFRAA >60 >60  ANIONGAP 12 10     Hematology Recent Labs  Lab 02/15/19 1251 02/16/19 0553  WBC 7.7 14.0*  RBC 3.98 3.90  HGB 12.0 11.9*  HCT 36.2 37.1  MCV 91.0 95.1  MCH 30.2 30.5  MCHC 33.1 32.1  RDW 12.3 12.6  PLT 232 227    BNPNo results for input(s): BNP, PROBNP in the last 168 hours.   DDimer No results for input(s): DDIMER in the last 168 hours.   Radiology    Dg Chest 1 View  Result Date: 02/15/2019 CLINICAL DATA:  Fall. EXAM: CHEST  1 VIEW COMPARISON:  Radiographs of June 13, 2018. FINDINGS: The heart size and mediastinal contours are within normal limits. No pneumothorax or pleural effusion is noted. Left-sided pacemaker is unchanged in position. Both lungs are clear. Mildly displaced proximal right humeral neck fracture is noted. IMPRESSION: No acute cardiopulmonary abnormality seen. Mildly displaced proximal right humeral neck fracture. Electronically Signed   By: Lupita RaiderJames  Green Jr M.D.   On:  02/15/2019 13:55   Dg Shoulder Right  Result Date: 02/15/2019 CLINICAL DATA:  Right shoulder pain after fall today. EXAM: RIGHT SHOULDER - 2+ VIEW COMPARISON:  None. FINDINGS: Mildly displaced proximal right humeral neck fracture is noted. Severe degenerative changes seen involving the right glenohumeral joint. Visualized ribs are unremarkable. IMPRESSION: Mildly displaced proximal right humeral neck fracture. Electronically Signed   By: Lupita Raider M.D.   On: 02/15/2019 13:56   Ct Head Wo Contrast  Result Date: 02/15/2019 CLINICAL DATA:  Status post fall today and landed on the face. Regions to the forehead, nose, lips EXAM: CT HEAD WITHOUT CONTRAST CT MAXILLOFACIAL WITHOUT CONTRAST CT CERVICAL SPINE WITHOUT CONTRAST TECHNIQUE: Multidetector CT imaging of the head, cervical spine, and maxillofacial structures were performed using the standard protocol without intravenous contrast. Multiplanar CT image reconstructions of the cervical spine and maxillofacial  structures were also generated. COMPARISON:  None. FINDINGS: CT HEAD FINDINGS Brain: No evidence of acute infarction, hemorrhage, extra-axial collection, ventriculomegaly, or mass effect. Generalized cerebral atrophy. Periventricular white matter low attenuation likely secondary to microangiopathy. Vascular: Cerebrovascular atherosclerotic calcifications are noted. Skull: Negative for fracture or focal lesion. Sinuses/Orbits: Visualized portions of the orbits are unremarkable. Visualized portions of the paranasal sinuses and mastoid air cells are unremarkable. Other: None. CT MAXILLOFACIAL FINDINGS Osseous: No mandibular fracture or dislocation. Mildly comminuted right nasal bone fracture. Nondisplaced left nasal bone fracture. Overlying soft tissue swelling. Temporomandibular joints are normal. No aggressive osseous lesion. Orbits: Negative. No traumatic or inflammatory finding. Sinuses: Paranasal sinuses are clear. Mastoid sinuses are clear. Rightward deviation of the nasal septum. Soft tissues: No fluid collection or hematoma. Soft tissue swelling over the nose. CT CERVICAL SPINE FINDINGS Alignment: Normal. Skull base and vertebrae: No acute fracture. No primary bone lesion or focal pathologic process. Soft tissues and spinal canal: No prevertebral fluid or swelling. No visible canal hematoma. Disc levels: Degenerative disease with disc height loss at C6-7 and C7-T1. Right uncovertebral degenerative changes at C6-7 and C7-T1 with foraminal stenosis. Bilateral facet arthropathy at C7-T1. Upper chest: Lung apices are clear. Other: No fluid collection or hematoma. IMPRESSION: 1. No acute intracranial pathology. 2. Mildly comminuted right nasal bone fracture. Nondisplaced left nasal bone fracture. Overlying soft tissue swelling. 3.  No acute osseous injury of the cervical spine. Electronically Signed   By: Elige Ko   On: 02/15/2019 13:44   Ct Cervical Spine Wo Contrast  Result Date: 02/15/2019 CLINICAL DATA:   Status post fall today and landed on the face. Regions to the forehead, nose, lips EXAM: CT HEAD WITHOUT CONTRAST CT MAXILLOFACIAL WITHOUT CONTRAST CT CERVICAL SPINE WITHOUT CONTRAST TECHNIQUE: Multidetector CT imaging of the head, cervical spine, and maxillofacial structures were performed using the standard protocol without intravenous contrast. Multiplanar CT image reconstructions of the cervical spine and maxillofacial structures were also generated. COMPARISON:  None. FINDINGS: CT HEAD FINDINGS Brain: No evidence of acute infarction, hemorrhage, extra-axial collection, ventriculomegaly, or mass effect. Generalized cerebral atrophy. Periventricular white matter low attenuation likely secondary to microangiopathy. Vascular: Cerebrovascular atherosclerotic calcifications are noted. Skull: Negative for fracture or focal lesion. Sinuses/Orbits: Visualized portions of the orbits are unremarkable. Visualized portions of the paranasal sinuses and mastoid air cells are unremarkable. Other: None. CT MAXILLOFACIAL FINDINGS Osseous: No mandibular fracture or dislocation. Mildly comminuted right nasal bone fracture. Nondisplaced left nasal bone fracture. Overlying soft tissue swelling. Temporomandibular joints are normal. No aggressive osseous lesion. Orbits: Negative. No traumatic or inflammatory finding. Sinuses: Paranasal sinuses are clear. Mastoid sinuses are  clear. Rightward deviation of the nasal septum. Soft tissues: No fluid collection or hematoma. Soft tissue swelling over the nose. CT CERVICAL SPINE FINDINGS Alignment: Normal. Skull base and vertebrae: No acute fracture. No primary bone lesion or focal pathologic process. Soft tissues and spinal canal: No prevertebral fluid or swelling. No visible canal hematoma. Disc levels: Degenerative disease with disc height loss at C6-7 and C7-T1. Right uncovertebral degenerative changes at C6-7 and C7-T1 with foraminal stenosis. Bilateral facet arthropathy at C7-T1. Upper  chest: Lung apices are clear. Other: No fluid collection or hematoma. IMPRESSION: 1. No acute intracranial pathology. 2. Mildly comminuted right nasal bone fracture. Nondisplaced left nasal bone fracture. Overlying soft tissue swelling. 3.  No acute osseous injury of the cervical spine. Electronically Signed   By: Elige KoHetal  Patel   On: 02/15/2019 13:44   Ct Maxillofacial Wo Contrast  Result Date: 02/15/2019 CLINICAL DATA:  Status post fall today and landed on the face. Regions to the forehead, nose, lips EXAM: CT HEAD WITHOUT CONTRAST CT MAXILLOFACIAL WITHOUT CONTRAST CT CERVICAL SPINE WITHOUT CONTRAST TECHNIQUE: Multidetector CT imaging of the head, cervical spine, and maxillofacial structures were performed using the standard protocol without intravenous contrast. Multiplanar CT image reconstructions of the cervical spine and maxillofacial structures were also generated. COMPARISON:  None. FINDINGS: CT HEAD FINDINGS Brain: No evidence of acute infarction, hemorrhage, extra-axial collection, ventriculomegaly, or mass effect. Generalized cerebral atrophy. Periventricular white matter low attenuation likely secondary to microangiopathy. Vascular: Cerebrovascular atherosclerotic calcifications are noted. Skull: Negative for fracture or focal lesion. Sinuses/Orbits: Visualized portions of the orbits are unremarkable. Visualized portions of the paranasal sinuses and mastoid air cells are unremarkable. Other: None. CT MAXILLOFACIAL FINDINGS Osseous: No mandibular fracture or dislocation. Mildly comminuted right nasal bone fracture. Nondisplaced left nasal bone fracture. Overlying soft tissue swelling. Temporomandibular joints are normal. No aggressive osseous lesion. Orbits: Negative. No traumatic or inflammatory finding. Sinuses: Paranasal sinuses are clear. Mastoid sinuses are clear. Rightward deviation of the nasal septum. Soft tissues: No fluid collection or hematoma. Soft tissue swelling over the nose. CT CERVICAL  SPINE FINDINGS Alignment: Normal. Skull base and vertebrae: No acute fracture. No primary bone lesion or focal pathologic process. Soft tissues and spinal canal: No prevertebral fluid or swelling. No visible canal hematoma. Disc levels: Degenerative disease with disc height loss at C6-7 and C7-T1. Right uncovertebral degenerative changes at C6-7 and C7-T1 with foraminal stenosis. Bilateral facet arthropathy at C7-T1. Upper chest: Lung apices are clear. Other: No fluid collection or hematoma. IMPRESSION: 1. No acute intracranial pathology. 2. Mildly comminuted right nasal bone fracture. Nondisplaced left nasal bone fracture. Overlying soft tissue swelling. 3.  No acute osseous injury of the cervical spine. Electronically Signed   By: Elige KoHetal  Patel   On: 02/15/2019 13:44    Cardiac Studies   IMPRESSIONS    1. The left ventricle has normal systolic function with an ejection fraction of 60-65%. The cavity size was normal. Left ventricular diastolic parameters were normal. No evidence of left ventricular regional wall motion abnormalities.  2. The right ventricle has normal systolic function. The cavity was normal. There is no increase in right ventricular wall thickness. Right ventricular systolic pressure is mildly elevated with an estimated pressure of 36.2 mmHg.  3. Tricuspid valve regurgitation is mild-moderate.  4. The aortic root and ascending aorta are normal in size and structure.  FINDINGS  Left Ventricle: The left ventricle has normal systolic function, with an ejection fraction of 60-65%. The cavity size was normal. There is  no increase in left ventricular wall thickness. Left ventricular diastolic parameters were normal. Normal left  ventricular filling pressures No evidence of left ventricular regional wall motion abnormalities..  Right Ventricle: The right ventricle has normal systolic function. The cavity was normal. There is no increase in right ventricular wall thickness. Right  ventricular systolic pressure is mildly elevated with an estimated pressure of 36.2 mmHg. Pacing  wire/catheter visualized in the right ventricle.  Left Atrium: Left atrial size was normal in size.  Right Atrium: Right atrial size was normal in size. Right atrial pressure is estimated at 3 mmHg.  Interatrial Septum: No atrial level shunt detected by color flow Doppler.  Pericardium: There is no evidence of pericardial effusion. There is a pericardial fat pad noted.  Mitral Valve: The mitral valve is normal in structure. Mitral valve regurgitation is trivial by color flow Doppler.  Tricuspid Valve: The tricuspid valve is normal in structure. Tricuspid valve regurgitation is mild-moderate by color flow Doppler.  Aortic Valve: The aortic valve is normal in structure. Aortic valve regurgitation is mild by color flow Doppler.  Pulmonic Valve: The pulmonic valve was grossly normal. Pulmonic valve regurgitation is not visualized by color flow Doppler.  Aorta: The aortic root and ascending aorta are normal in size and structure.    +--------------+--------++  LEFT VENTRICLE            +----------------+----------++ +--------------+--------++  Diastology                     PLAX 2D                   +----------------+----------++ +--------------+--------++  LV e' lateral:   10.70 cm/s    LVIDd:         4.64 cm    +----------------+----------++ +--------------+--------++  LV E/e' lateral: 7.9           LVIDs:         3.04 cm    +----------------+----------++ +--------------+--------++  LV e' medial:    5.33 cm/s     LV PW:         0.85 cm    +----------------+----------++ +--------------+--------++  LV E/e' medial:  15.8          LV IVS:        0.86 cm    +----------------+----------++ +--------------+--------++  LVOT diam:     1.70 cm    +--------------+--------++  LV SV:         63 ml      +--------------+--------++  LV SV Index:   34.97       +--------------+--------++  LVOT Area:     2.27 cm   +--------------+--------++                            +--------------+--------++  +---------------+----------++  RIGHT VENTRICLE              +---------------+----------++  RV S prime:     11.40 cm/s   +---------------+----------++  TAPSE (M-mode): 1.2 cm       +---------------+----------++  RVSP:           36.2 mmHg    +---------------+----------++  +-------------+-------++-----------++  LEFT ATRIUM            Index         +-------------+-------++-----------++  LA diam:      3.40 cm  1.98 cm/m    +-------------+-------++-----------++  LA Vol (A2C): 20.1 ml  11.72 ml/m   +-------------+-------++-----------++  LA Vol (A4C): 31.7 ml  18.48 ml/m   +-------------+-------++-----------++ +------------+---------+++  RIGHT ATRIUM              +------------+---------+++  RA Pressure: 3.00 mmHg    +------------+---------+++  +------------+-----------++  AORTIC VALVE               +------------+-----------++  LVOT Vmax:   99.90 cm/s    +------------+-----------++  LVOT Vmean:  64.700 cm/s   +------------+-----------++  LVOT VTI:    0.213 m       +------------+-----------++  AR PHT:      389 msec      +------------+-----------++   +-------------+-------++  AORTA                   +-------------+-------++  Ao Root diam: 3.00 cm   +-------------+-------++  Ao Asc diam:  3.20 cm   +-------------+-------++  +--------------+----------++ +---------------+-----------++  MITRAL VALVE                 TRICUSPID VALVE               +--------------+----------++ +---------------+-----------++  MV Area (PHT): 6.17 cm      TR Peak grad:   33.2 mmHg     +--------------+----------++ +---------------+-----------++  MV PHT:        35.67 msec    TR Vmax:        303.00 cm/s   +--------------+----------++ +---------------+-----------++  MV Decel Time: 123 msec      Estimated RAP:  3.00 mmHg     +--------------+----------++  +---------------+-----------++ +--------------+----------++  RVSP:           36.2 mmHg      MV E velocity: 84.40 cm/s   +---------------+-----------++ +--------------+----------++  MV A velocity: 70.20 cm/s   +--------------+-------+ +--------------+----------++  SHUNTS                   MV E/A ratio:  1.20         +--------------+-------+ +--------------+----------++  Systemic VTI:  0.21 m                                +--------------+-------+                               Systemic Diam: 1.70 cm                               +--------------+-------+    Kayla Klein MD Electronically signed by Kayla Klein MD Signature Date/Time: 02/16/2019/4:16:19 PM     Patient Profile     73 y.o. female with hx of tachybrady syndrome.  S/p dual chamber PPM, PAF who presents for evaluation of syncope  Assessment & Plan    1  Syncope  3 episodes in 3 wks   Pacer interrogated   Negative for arrhythmia.  Orthostatics negative   I would repeat  On talking to the pt, one spell with no real prodrome   At husbands office   [address] out   Last episode she was walking up hill   At 11 AM   Warm and weak   Sounds orthostatic    WOuld consult neuro to evaluate    REpeat orthostatic  Avoid dehydration   Pt warned about watching for symptoms in  future   If feels dizzy then needs to sit.  2  Tachybrady syndrome  She is s/p PPM   [place] OK  NO arrhythmias  3  PAF  No anticoagulation due to bleeding risk Nothing associated with recent events For questions or updates, please contact [facility] Please consult [email or web address] for contact info under        Signed, Kayla Pates, MD  02/17/2019, 7:57 AM

## 2019-02-17 NOTE — Evaluation (Signed)
Occupational Therapy Evaluation Patient Details Name: Kayla Beard MRN: [id number] DOB: 10-26-1945 Today's Date: 02/17/2019    History of Present Illness 73 y.o. female who complains of right shoulder pain after she fell onto her shoulder yesterday.  She also fractured her nose.  She had a syncopal event and was therefore admitted to [facility] long for medical work-up.   Clinical Impression   Pt was admitted for the above.  At baseline, she was independent with adls but reports 3 falls in last month.  She reports mostly from legs giving out, but she had syncopal episode this time.  If husband can provide needed assistance, recommend 24/7, HHOT and [place] aidel  If not, then SNF.  Will follow in acute setting with min A level goals and will reinforce adls without moving R shoulder    Follow Up Recommendations  Home health OT;Supervision/Assistance - 24 hour(and HH aide vs SNF if husband cannot manage)    Equipment Recommendations  3 in 1 bedside commode    Recommendations for Other Services       Precautions / Restrictions Precautions Precautions: Fall Required Braces or Orthoses: Sling Restrictions Weight Bearing Restrictions: Yes RUE Weight Bearing: Non weight bearing      Mobility Bed Mobility Overal bed mobility: Needs Assistance Bed Mobility: Supine to Sit;Sit to Supine     Supine to sit: Mod assist Sit to supine: Min assist   General bed mobility comments: assist for trunk to sit up and assist for second leg when getting back into bed.  +2 assist to scoot up in bed  Transfers Overall transfer level: Needs assistance Equipment used: 1 person hand held assist Transfers: Sit to/from Stand;Stand Pivot Transfers Sit to Stand: Min assist Stand pivot transfers: Min assist       General transfer comment: min A to transition to standing and light assistance for SPT to 3:1 commode    Balance Overall balance assessment: (3 falls in the last month)                                          ADL either performed or assessed with clinical judgement   ADL Overall ADL's : Needs assistance/impaired Eating/Feeding: Set up   Grooming: Set up   Upper Body Bathing: Moderate assistance   Lower Body Bathing: Maximal assistance   Upper Body Dressing : Maximal assistance   Lower Body Dressing: Maximal assistance   Toilet Transfer: Minimal assistance   Toileting- Clothing Manipulation and Hygiene: Maximal assistance         General ADL Comments: adjusted sling for increased support. May benefit from AD to steady when standing.  Educated on how to perform ADLs without moving shoulder as well as positioning in bed. Pt is R handed       Vision         Perception     Praxis      Pertinent Vitals/Pain Pain Assessment: Faces Faces Pain Scale: Hurts little more Pain Location: right shoulder Pain Descriptors / Indicators: Sore Pain Intervention(s): Limited activity within patient's tolerance;Monitored during session;Repositioned     Hand Dominance     Extremity/Trunk Assessment Upper Extremity Assessment Upper Extremity Assessment: RUE deficits/detail;Generalized weakness(immoblized; able to move fingers)   Lower Extremity Assessment Lower Extremity Assessment: Generalized weakness       Communication Communication Communication: No difficulties   Cognition Arousal/Alertness: Awake/alert Behavior During Therapy: [facility] for  tasks assessed/performed   Area of Impairment: Memory                     Memory: Decreased short-term memory         General Comments: pt initially did not want 3:1 commode but did not feel she could walk to bathroom. Agreeable when educated that she could use it by bedside if needed and also over toilet so that she can push up with LUE   General Comments       Exercises     Shoulder Instructions      Home Living Family/patient expects to be discharged to:: Private residence   Available Help  at Discharge: Family Type of Home: House Home Access: Stairs to enter Entergy CorporationEntrance Stairs-Number of Steps: 3 and 3 more (rail for second 3 ) Entrance Stairs-Rails: Right Home Layout: One level               Home Equipment: Walker - 2 wheels;Cane - quad   Additional Comments: has standard toilet and walk in shower with a seat      Prior Functioning/Environment Level of Independence: Independent                 OT Problem List: Decreased strength;Decreased activity tolerance;Impaired balance (sitting and/or standing);Decreased cognition;Decreased knowledge of use of DME or AE;Pain      OT Treatment/Interventions: Self-care/ADL training;Energy conservation;DME and/or AE instruction;Therapeutic activities;Patient/family education;Cognitive remediation/compensation;Balance training    OT Goals(Current goals can be found in the care plan section) Acute Rehab OT Goals Patient Stated Goal: home OT Goal Formulation: With patient Time For Goal Achievement: 03/03/19 Potential to Achieve Goals: Good ADL Goals Pt Will Transfer to Toilet: with min guard assist;bedside commode;ambulating Pt Will Perform Toileting - Clothing Manipulation and hygiene: with min guard assist;sit to/from stand Additional ADL Goal #1: pt will perform bed mobility with min A in preparation for adls Additional ADL Goal #2: pt will verbalize how to perform ADL without moving shoulder and positioning for comfort in bed  OT Frequency: Min 2X/week   Barriers to D/C:            Co-evaluation              AM-PAC OT "6 Clicks" Daily Activity     Outcome Measure Help from another person eating meals?: A Little Help from another person taking care of personal grooming?: A Little Help from another person toileting, which includes using toliet, bedpan, or urinal?: A Lot Help from another person bathing (including washing, rinsing, drying)?: A Lot Help from another person to put on and taking off regular  upper body clothing?: A Lot Help from another person to put on and taking off regular lower body clothing?: A Lot 6 Click Score: 14   End of Session    Activity Tolerance: Patient tolerated treatment well Patient left: in bed;with call bell/phone within reach;with bed alarm set  OT Visit Diagnosis: Pain;Muscle weakness (generalized) (M62.81);Repeated falls (R29.6) Pain - Right/Left: Right Pain - part of body: Shoulder                Time: 1240-1314 OT Time Calculation (min): 34 min Charges:  OT General Charges $OT Visit: 1 Visit OT Evaluation $OT Eval Low Complexity: 1 Low OT Treatments $Self Care/Home Management : 8-22 mins  Marica OtterMaryellen Elaya Droege, OTR/L Acute Rehabilitation Services [phone] [facility] pager [phone] office 02/17/2019  Kylian Loh 02/17/2019, 2:08 PM

## 2019-05-09 ENCOUNTER — Encounter: Payer: Self-pay | Admitting: Adult Health

## 2019-05-09 ENCOUNTER — Other Ambulatory Visit: Payer: Self-pay

## 2019-05-09 ENCOUNTER — Ambulatory Visit (INDEPENDENT_AMBULATORY_CARE_PROVIDER_SITE_OTHER): Payer: Medicare HMO | Admitting: Adult Health

## 2019-05-09 DIAGNOSIS — F331 Major depressive disorder, recurrent, moderate: Secondary | ICD-10-CM | POA: Diagnosis not present

## 2019-05-09 DIAGNOSIS — F410 Panic disorder [episodic paroxysmal anxiety] without agoraphobia: Secondary | ICD-10-CM

## 2019-05-09 DIAGNOSIS — F411 Generalized anxiety disorder: Secondary | ICD-10-CM

## 2019-05-09 DIAGNOSIS — F429 Obsessive-compulsive disorder, unspecified: Secondary | ICD-10-CM

## 2019-05-09 DIAGNOSIS — E785 Hyperlipidemia, unspecified: Secondary | ICD-10-CM | POA: Insufficient documentation

## 2019-05-09 DIAGNOSIS — K219 Gastro-esophageal reflux disease without esophagitis: Secondary | ICD-10-CM | POA: Insufficient documentation

## 2019-05-09 MED ORDER — ALPRAZOLAM 1 MG PO TABS: ORAL_TABLET | ORAL | 2 refills | Status: DC

## 2019-05-09 MED ORDER — FLUOXETINE HCL 20 MG PO CAPS: 20.0000 mg | ORAL_CAPSULE | Freq: Three times a day (TID) | ORAL | 5 refills | Status: DC

## 2019-05-09 MED ORDER — BUSPIRONE HCL 15 MG PO TABS: 15.0000 mg | ORAL_TABLET | Freq: Three times a day (TID) | ORAL | 5 refills | Status: DC

## 2019-05-09 NOTE — Progress Notes (Signed)
Kayla Beard [id number] [birth year] 73 y.o.  Subjective:   Patient ID:  Kayla Beard is a 73 y.o. (DOB 05/25/1946) female.  Chief Complaint:  Chief Complaint  Patient presents with  . Depression  . Anxiety  . Other    OCD  . Panic Attack    HPI Kayla Beard presents to the office today for follow-up of anxiety, depression, OCD, and panic attacks.  Describes mood today as "ok". Pleasant. Mood symptoms - reports depression, anxiety, and irritability. Stating "I haven't been doing so well". Kayla Beard a few months ago and continues to recover. Working with P/T. Stating "I'm working hard to get myself back together". Struggling with "pain". Stable interest and motivation. Taking medications as prescribed.  Energy levels decreased.  Active, does not have a regular exercise routine with current disabilities. Retired. Enjoys some usual interests and activities. Married - lives with husband. Spending time with family - 2 sons - and grandchildren. Appetite adequate. Weight stable. Sleeps well most nights. Averages 6 to 8 hours. Sleeping in a lazy boy most night.  Focus and concentration stable. Completing tasks. Managing aspects of household.  Denies SI or HI. Denies AH or VH.  Review of Systems:  Review of Systems  Musculoskeletal: Positive for gait problem.  Neurological: Positive for weakness.    Medications: I have reviewed the patient's current medications.  Current Outpatient Medications  Medication Sig Dispense Refill  . acetaminophen (TYLENOL) 325 MG tablet Take 2 tablets (650 mg total) by mouth every 6 (six) hours as needed for mild pain or moderate pain. 30 tablet 3  . ALPRAZolam (XANAX) 1 MG tablet Take 1/2 tablet three times daily and 1/2 tab extra for severe anxiety. 60 tablet 2  . busPIRone (BUSPAR) 15 MG tablet Take 1 tablet (15 mg total) by mouth 3 (three) times daily. 90 tablet 5  . FLUoxetine (PROZAC) 20 MG capsule Take 1 capsule (20 mg total) by mouth 3 (three) times daily. 90  capsule 5  . ipratropium (ATROVENT) 0.03 % nasal spray SPRAY 2 SPRAYS INTO EACH NOSTRIL TWICE A DAY    . lansoprazole (PREVACID SOLUTAB) 30 MG disintegrating tablet Take 1 tablet (30 mg total) by mouth daily. 21 tablet 1  . metoprolol (TOPROL-XL) 50 MG 24 hr tablet Take 50 mg by mouth daily.      . metoprolol tartrate (LOPRESSOR) 25 MG tablet TAKE 0.5 TABLETS (12.5 MG TOTAL) BY MOUTH 2 TIMES DAILY.    . pantoprazole (PROTONIX) 40 MG tablet TAKE 1 TABLET BY MOUTH EVERYDAY AT BEDTIME    . rosuvastatin (CRESTOR) 10 MG tablet Take 10 mg by mouth daily.    . SYMBICORT 160-4.5 MCG/ACT inhaler USE 2 INHALATIONS TWICE A DAY     No current facility-administered medications for this visit.     Medication Side Effects: None  Allergies:  Allergies  Allergen Reactions  . Hydroxyzine     Other reaction(s): Other (See Comments) Sleepiness Nightmares   . Prednisone Anxiety    Other reaction(s): Other (See Comments)  . Rivaroxaban Other (See Comments)    Bleeding Bleeding   . Clindamycin Diarrhea  . Dicyclomine     Other reaction(s): Other (See Comments) Patient doesn't recall side effect  . Doxepin   . Duloxetine     Other reaction(s): Other (See Comments) Patient doesn't recall side effect  . Erythromycin Base Diarrhea  . Escitalopram     Other reaction(s): Other (See Comments) Intensifies ocd  . Flecainide     Other reaction(s): Other (See Comments)  weakness  . Hydrocodone-Homatropine Other (See Comments)    sleepy  . Lurasidone     Other reaction(s): Other (See Comments) diskinesia  . Paroxetine Hcl     Other reaction(s): Other (See Comments) Patient doesn't recall side effect  . Tizanidine     Other reaction(s): Other (See Comments) Patient doesn't recall side effect  . Amoxicillin Rash  . Levofloxacin Diarrhea  . Metformin And Related Diarrhea    Past Medical History:  Diagnosis Date  . Anxiety   . Asthma   . Depression   . GERD (gastroesophageal reflux disease)    . Hypercholesterolemia   . Hypertension   . Obsessive compulsive disorder     Family History  Family history unknown: Yes    Social History   Socioeconomic History  . Marital status: Married    Spouse name: Not on file  . Number of children: Not on file  . Years of education: Not on file  . Highest education level: Not on file  Occupational History  . Not on file  Social Needs  . Financial resource strain: Not on file  . Food insecurity    Worry: Not on file    Inability: Not on file  . Transportation needs    Medical: Not on file    Non-medical: Not on file  Tobacco Use  . Smoking status: Never Smoker  . Smokeless tobacco: Never Used  Substance and Sexual Activity  . Alcohol use: No  . Drug use: No  . Sexual activity: Not on file  Lifestyle  . Physical activity    Days per week: Not on file    Minutes per session: Not on file  . Stress: Not on file  Relationships  . Social Musician on phone: Not on file    Gets together: Not on file    Attends religious service: Not on file    Active member of club or organization: Not on file    Attends meetings of clubs or organizations: Not on file    Relationship status: Not on file  . Intimate partner violence    Fear of current or ex partner: Not on file    Emotionally abused: Not on file    Physically abused: Not on file    Forced sexual activity: Not on file  Other Topics Concern  . Not on file  Social History Narrative  . Not on file    Past Medical History, Surgical history, Social history, and Family history were reviewed and updated as appropriate.   Please see review of systems for further details on the patient's review from today.   Objective:   Physical Exam:  There were no vitals taken for this visit.  Physical Exam Neurological:     Mental Status: She is oriented to person, place, and time.  Psychiatric:        Attention and Perception: Attention normal.        Mood and Affect:  Mood is anxious and depressed.        Speech: Speech normal.        Behavior: Behavior normal.        Thought Content: Thought content normal.        Cognition and Memory: Cognition normal.        Judgment: Judgment normal.    Lab Review:     Component Value Date/Time   NA 136 02/16/2019 0553   K 3.7 02/16/2019 0553   CL 104 02/16/2019  0553   CO2 22 02/16/2019 0553   GLUCOSE 128 (H) 02/16/2019 0553   BUN 12 02/16/2019 0553   CREATININE 0.69 02/16/2019 0553   CALCIUM 8.1 (L) 02/16/2019 0553   PROT 6.7 02/15/2019 1251   ALBUMIN 3.6 02/15/2019 1251   AST 42 (H) 02/15/2019 1251   ALT 41 02/15/2019 1251   ALKPHOS 63 02/15/2019 1251   BILITOT 0.9 02/15/2019 1251   GFRNONAA >60 02/16/2019 0553   GFRAA >60 02/16/2019 0553       Component Value Date/Time   WBC 14.0 (H) 02/16/2019 0553   RBC 3.90 02/16/2019 0553   HGB 11.9 (L) 02/16/2019 0553   HCT 37.1 02/16/2019 0553   PLT 227 02/16/2019 0553   MCV 95.1 02/16/2019 0553   MCH 30.5 02/16/2019 0553   MCHC 32.1 02/16/2019 0553   RDW 12.6 02/16/2019 0553   LYMPHSABS 1.6 03/12/2011 1154   MONOABS 0.6 03/12/2011 1154   EOSABS 0.1 03/12/2011 1154   BASOSABS 0.0 03/12/2011 1154    No results found for: POCLITH, LITHIUM   No results found for: PHENYTOIN, PHENOBARB, VALPROATE, CBMZ   .res Assessment: Plan:    Plan:  1. Prozac 20mg  TID 2. Xanax 1mg  TID 3. Buspar 15mg  TID  RTC 4 weeks  Patient advised to contact office with any questions, adverse effects, or acute worsening in signs and symptoms.  Madellyn was seen today for depression, anxiety, other and panic attack.  Diagnoses and all orders for this visit:  Generalized anxiety disorder -     ALPRAZolam (XANAX) 1 MG tablet; Take 1/2 tablet three times daily and 1/2 tab extra for severe anxiety. -     busPIRone (BUSPAR) 15 MG tablet; Take 1 tablet (15 mg total) by mouth 3 (three) times daily. -     FLUoxetine (PROZAC) 20 MG capsule; Take 1 capsule (20 mg total) by mouth  3 (three) times daily.  Major depressive disorder, recurrent episode, moderate (HCC) -     FLUoxetine (PROZAC) 20 MG capsule; Take 1 capsule (20 mg total) by mouth 3 (three) times daily.  Obsessive-compulsive disorder, unspecified type -     FLUoxetine (PROZAC) 20 MG capsule; Take 1 capsule (20 mg total) by mouth 3 (three) times daily.  Panic attacks -     ALPRAZolam (XANAX) 1 MG tablet; Take 1/2 tablet three times daily and 1/2 tab extra for severe anxiety.     Please see After Visit Summary for patient specific instructions.  No future appointments.  No orders of the defined types were placed in this encounter.   -------------------------------

## 2019-06-12 ENCOUNTER — Other Ambulatory Visit: Payer: Self-pay | Admitting: Adult Health

## 2019-06-12 DIAGNOSIS — F411 Generalized anxiety disorder: Secondary | ICD-10-CM

## 2019-09-12 ENCOUNTER — Other Ambulatory Visit: Payer: Self-pay

## 2019-09-12 ENCOUNTER — Encounter: Payer: Self-pay | Admitting: Adult Health

## 2019-09-12 ENCOUNTER — Ambulatory Visit (INDEPENDENT_AMBULATORY_CARE_PROVIDER_SITE_OTHER): Payer: Medicare HMO | Admitting: Adult Health

## 2019-09-12 DIAGNOSIS — F411 Generalized anxiety disorder: Secondary | ICD-10-CM | POA: Diagnosis not present

## 2019-09-12 DIAGNOSIS — F331 Major depressive disorder, recurrent, moderate: Secondary | ICD-10-CM

## 2019-09-12 DIAGNOSIS — F429 Obsessive-compulsive disorder, unspecified: Secondary | ICD-10-CM | POA: Diagnosis not present

## 2019-09-12 DIAGNOSIS — F410 Panic disorder [episodic paroxysmal anxiety] without agoraphobia: Secondary | ICD-10-CM | POA: Diagnosis not present

## 2019-09-12 MED ORDER — BUSPIRONE HCL 15 MG PO TABS: 15.0000 mg | ORAL_TABLET | Freq: Three times a day (TID) | ORAL | 3 refills | Status: DC

## 2019-09-12 MED ORDER — FLUOXETINE HCL 20 MG PO CAPS: 20.0000 mg | ORAL_CAPSULE | Freq: Three times a day (TID) | ORAL | 3 refills | Status: DC

## 2019-09-12 MED ORDER — ALPRAZOLAM 1 MG PO TABS: ORAL_TABLET | ORAL | 2 refills | Status: DC

## 2019-09-12 NOTE — Progress Notes (Signed)
Kayla Beard [id number] 1946/02/04 74 y.o.  Subjective:   Patient ID:  Kayla Beard is a 74 y.o. (DOB 12-02-1945) female.  Chief Complaint:  Chief Complaint  Patient presents with  . Anxiety  . Depression  . Panic Attack  . Other    OCD   HPI   Kayla Beard presents to the office today for follow-up of anxiety, depression, OCD, and panic attacks.  Describes mood today as "ok". Pleasant. Mood symptoms - reports depression, anxiety, and irritability - "having good and bad days". Continues to recover from fall back in July. Involved in church activities - "it stresses me out a lot of times". Medications do "work well" for her. Has episodes of getting "extra anxious" from time to time. Has not started driving yet. Hasn't felt "comfortable. Seen by PCP for 2 UTI's over past month. Stable interest and motivation. Taking medications as prescribed.  Energy levels improved. Active, does not have a regular exercise routine with current disabilities.  Enjoys some usual interests and activities. Married. Lives with husband of 15 years. Has 2 grown sons. Spending time with family.  Appetite adequate. Weight stable. Sleeps well most nights. Averages 6 to 8 hours - tossing and turning some nights - "chronic pain".  Focus and concentration stable. Completing tasks. Managing aspects of household.  Denies SI or HI. Denies AH or VH.  Review of Systems:  Review of Systems  Musculoskeletal: Negative for gait problem.  Neurological: Negative for tremors.  Psychiatric/Behavioral:       Please refer to HPI   Medications: I have reviewed the patient's current medications.  Current Outpatient Medications  Medication Sig Dispense Refill  . acetaminophen (TYLENOL) 325 MG tablet Take 2 tablets (650 mg total) by mouth every 6 (six) hours as needed for mild pain or moderate pain. 30 tablet 3  . alendronate (FOSAMAX) 70 MG tablet Take 70 mg by mouth every morning.    Marland Kitchen ALPRAZolam (XANAX) 1 MG tablet Take 1/2 tablet  three times daily and 1/2 tab extra for severe anxiety. 60 tablet 2  . busPIRone (BUSPAR) 15 MG tablet Take 1 tablet (15 mg total) by mouth 3 (three) times daily. 270 tablet 3  . diclofenac (VOLTAREN) 75 MG EC tablet Take 75 mg by mouth 2 (two) times daily.    Marland Kitchen ezetimibe (ZETIA) 10 MG tablet Take 10 mg by mouth daily.    Marland Kitchen FLUoxetine (PROZAC) 20 MG capsule Take 1 capsule (20 mg total) by mouth 3 (three) times daily. 270 capsule 3  . ipratropium (ATROVENT) 0.03 % nasal spray SPRAY 2 SPRAYS INTO EACH NOSTRIL TWICE A DAY    . lansoprazole (PREVACID SOLUTAB) 30 MG disintegrating tablet Take 1 tablet (30 mg total) by mouth daily. 21 tablet 1  . metoprolol (TOPROL-XL) 50 MG 24 hr tablet Take 50 mg by mouth daily.      . metoprolol tartrate (LOPRESSOR) 25 MG tablet TAKE 0.5 TABLETS (12.5 MG TOTAL) BY MOUTH 2 TIMES DAILY.    . nitrofurantoin, macrocrystal-monohydrate, (MACROBID) 100 MG capsule Take 100 mg by mouth 2 (two) times daily.    . pantoprazole (PROTONIX) 40 MG tablet TAKE 1 TABLET BY MOUTH EVERYDAY AT BEDTIME    . primidone (MYSOLINE) 50 MG tablet Take 50 mg by mouth 2 (two) times daily.    . rosuvastatin (CRESTOR) 10 MG tablet Take 10 mg by mouth daily.    . SYMBICORT 160-4.5 MCG/ACT inhaler USE 2 INHALATIONS TWICE A DAY     No current facility-administered medications  for this visit.    Medication Side Effects: None  Allergies:  Allergies  Allergen Reactions  . Hydroxyzine     Other reaction(s): Other (See Comments) Sleepiness Nightmares   . Prednisone Anxiety    Other reaction(s): Other (See Comments)  . Rivaroxaban Other (See Comments)    Bleeding Bleeding   . Clindamycin Diarrhea  . Dicyclomine     Other reaction(s): Other (See Comments) Patient doesn't recall side effect  . Doxepin   . Duloxetine     Other reaction(s): Other (See Comments) Patient doesn't recall side effect  . Erythromycin Base Diarrhea  . Escitalopram     Other reaction(s): Other (See  Comments) Intensifies ocd  . Flecainide     Other reaction(s): Other (See Comments) weakness  . Hydrocodone-Homatropine Other (See Comments)    sleepy  . Lurasidone     Other reaction(s): Other (See Comments) diskinesia  . Paroxetine Hcl     Other reaction(s): Other (See Comments) Patient doesn't recall side effect  . Tizanidine     Other reaction(s): Other (See Comments) Patient doesn't recall side effect  . Amoxicillin Rash  . Levofloxacin Diarrhea  . Metformin And Related Diarrhea    Past Medical History:  Diagnosis Date  . Anxiety   . Asthma   . Depression   . GERD (gastroesophageal reflux disease)   . Hypercholesterolemia   . Hypertension   . Obsessive compulsive disorder     Family History  Family history unknown: Yes    Social History   Socioeconomic History  . Marital status: Married    Spouse name: Not on file  . Number of children: Not on file  . Years of education: Not on file  . Highest education level: Not on file  Occupational History  . Not on file  Tobacco Use  . Smoking status: Never Smoker  . Smokeless tobacco: Never Used  Substance and Sexual Activity  . Alcohol use: No  . Drug use: No  . Sexual activity: Not on file  Other Topics Concern  . Not on file  Social History Narrative  . Not on file   Social Determinants of Health   Financial Resource Strain:   . Difficulty of Paying Living Expenses: Not on file  Food Insecurity:   . Worried About Programme researcher, broadcasting/film/video in the Last Year: Not on file  . Ran Out of Food in the Last Year: Not on file  Transportation Needs:   . Lack of Transportation (Medical): Not on file  . Lack of Transportation (Non-Medical): Not on file  Physical Activity:   . Days of Exercise per Week: Not on file  . Minutes of Exercise per Session: Not on file  Stress:   . Feeling of Stress : Not on file  Social Connections:   . Frequency of Communication with Friends and Family: Not on file  . Frequency of  Social Gatherings with Friends and Family: Not on file  . Attends Religious Services: Not on file  . Active Member of Clubs or Organizations: Not on file  . Attends Banker Meetings: Not on file  . Marital Status: Not on file  Intimate Partner Violence:   . Fear of Current or Ex-Partner: Not on file  . Emotionally Abused: Not on file  . Physically Abused: Not on file  . Sexually Abused: Not on file    Past Medical History, Surgical history, Social history, and Family history were reviewed and updated as appropriate.   Please  see review of systems for further details on the patient's review from today.   Objective:   Physical Exam:  There were no vitals taken for this visit.  Physical Exam Constitutional:      General: She is not in acute distress.    Appearance: She is well-developed.  Musculoskeletal:        General: No deformity.  Neurological:     Mental Status: She is alert and oriented to person, place, and time.     Coordination: Coordination normal.  Psychiatric:        Attention and Perception: Attention and perception normal. She does not perceive auditory or visual hallucinations.        Mood and Affect: Mood is anxious. Mood is not depressed. Affect is not labile, blunt, angry or inappropriate.        Speech: Speech normal.        Behavior: Behavior normal.        Thought Content: Thought content normal. Thought content is not paranoid or delusional. Thought content does not include homicidal or suicidal ideation. Thought content does not include homicidal or suicidal plan.        Cognition and Memory: Cognition and memory normal.        Judgment: Judgment normal.     Comments: Insight intact     Lab Review:     Component Value Date/Time   NA 136 02/16/2019 0553   K 3.7 02/16/2019 0553   CL 104 02/16/2019 0553   CO2 22 02/16/2019 0553   GLUCOSE 128 (H) 02/16/2019 0553   BUN 12 02/16/2019 0553   CREATININE 0.69 02/16/2019 0553   CALCIUM 8.1  (L) 02/16/2019 0553   PROT 6.7 02/15/2019 1251   ALBUMIN 3.6 02/15/2019 1251   AST 42 (H) 02/15/2019 1251   ALT 41 02/15/2019 1251   ALKPHOS 63 02/15/2019 1251   BILITOT 0.9 02/15/2019 1251   GFRNONAA >60 02/16/2019 0553   GFRAA >60 02/16/2019 0553       Component Value Date/Time   WBC 14.0 (H) 02/16/2019 0553   RBC 3.90 02/16/2019 0553   HGB 11.9 (L) 02/16/2019 0553   HCT 37.1 02/16/2019 0553   PLT 227 02/16/2019 0553   MCV 95.1 02/16/2019 0553   MCH 30.5 02/16/2019 0553   MCHC 32.1 02/16/2019 0553   RDW 12.6 02/16/2019 0553   LYMPHSABS 1.6 03/12/2011 1154   MONOABS 0.6 03/12/2011 1154   EOSABS 0.1 03/12/2011 1154   BASOSABS 0.0 03/12/2011 1154    No results found for: POCLITH, LITHIUM   No results found for: PHENYTOIN, PHENOBARB, VALPROATE, CBMZ   .res Assessment: Plan:    Plan:  1. Prozac 20mg  TID 2. Xanax 1mg  BID 3. Buspar 15mg  TID  RTC 3 months  Patient advised to contact office with any questions, adverse effects, or acute worsening in signs and symptoms.  Discussed potential benefits, risk, and side effects of benzodiazepines to include potential risk of tolerance and dependence, as well as possible drowsiness.  Advised patient not to drive if experiencing drowsiness and to take lowest possible effective dose to minimize risk of dependence and tolerance.  Alayjah was seen today for anxiety, depression, panic attack and other.  Diagnoses and all orders for this visit:  Panic attacks -     ALPRAZolam (XANAX) 1 MG tablet; Take 1/2 tablet three times daily and 1/2 tab extra for severe anxiety.  Obsessive-compulsive disorder, unspecified type -     FLUoxetine (PROZAC) 20 MG capsule; Take 1  capsule (20 mg total) by mouth 3 (three) times daily.  Major depressive disorder, recurrent episode, moderate (HCC) -     FLUoxetine (PROZAC) 20 MG capsule; Take 1 capsule (20 mg total) by mouth 3 (three) times daily.  Generalized anxiety disorder -     FLUoxetine  (PROZAC) 20 MG capsule; Take 1 capsule (20 mg total) by mouth 3 (three) times daily. -     busPIRone (BUSPAR) 15 MG tablet; Take 1 tablet (15 mg total) by mouth 3 (three) times daily. -     ALPRAZolam (XANAX) 1 MG tablet; Take 1/2 tablet three times daily and 1/2 tab extra for severe anxiety.     Please see After Visit Summary for patient specific instructions.  Future Appointments  Date Time Provider Department Center  11/06/2019 11:40 AM Devonta Blanford, Thereasa Solo, NP [facility] None    No orders of the defined types were placed in this encounter.   -------------------------------

## 2019-11-06 ENCOUNTER — Ambulatory Visit: Payer: Medicare HMO | Admitting: Adult Health

## 2019-12-11 ENCOUNTER — Ambulatory Visit (INDEPENDENT_AMBULATORY_CARE_PROVIDER_SITE_OTHER): Payer: Medicare HMO | Admitting: Adult Health

## 2019-12-11 ENCOUNTER — Other Ambulatory Visit: Payer: Self-pay

## 2019-12-11 ENCOUNTER — Encounter: Payer: Self-pay | Admitting: Adult Health

## 2019-12-11 DIAGNOSIS — F331 Major depressive disorder, recurrent, moderate: Secondary | ICD-10-CM

## 2019-12-11 DIAGNOSIS — F411 Generalized anxiety disorder: Secondary | ICD-10-CM | POA: Diagnosis not present

## 2019-12-11 DIAGNOSIS — F410 Panic disorder [episodic paroxysmal anxiety] without agoraphobia: Secondary | ICD-10-CM | POA: Diagnosis not present

## 2019-12-11 DIAGNOSIS — F429 Obsessive-compulsive disorder, unspecified: Secondary | ICD-10-CM

## 2019-12-11 MED ORDER — FLUOXETINE HCL 20 MG PO CAPS: 20.0000 mg | ORAL_CAPSULE | Freq: Three times a day (TID) | ORAL | 3 refills | Status: DC

## 2019-12-11 MED ORDER — ALPRAZOLAM 1 MG PO TABS: ORAL_TABLET | ORAL | 2 refills | Status: DC

## 2019-12-11 MED ORDER — BUSPIRONE HCL 15 MG PO TABS: 15.0000 mg | ORAL_TABLET | Freq: Three times a day (TID) | ORAL | 3 refills | Status: DC

## 2019-12-11 NOTE — Progress Notes (Signed)
Kayla Beard [id number] 1945-10-12 74 y.o.  Subjective:   Patient ID:  Kayla Beard is a 74 y.o. (DOB 13-Nov-1945) female.  Chief Complaint: No chief complaint on file.   HPI Kayla Beard presents to the office today for follow-up of anxiety, depression, OCD, and panic attacks.  Describes mood today as "ok". Pleasant. Mood symptoms - reports depression, anxiety, and irritability - "some days are better than others". Also stating "I have periods where I get "extra" anxious". [place] last July and has been recovering since. [place] a second time after returning home. Can be "unsteady" at times. Has a walker she keeps in the car to keep her "steady" when she needs it. Has worked with P/T, not exercising on her own. Involved in church. Following up with PCP. Seeing "foot doctor" today. Stable interest and motivation. Taking medications as prescribed.  Energy levels stable - "doing what I can do". Active, does not have a regular exercise routine with current disabilities.  Enjoys some usual interests and activities. Married. Lives with husband of 33 years. Has 2 grown sons. Nephew spent the weekend with her last weekend. Spending time with family.  Appetite adequate. Weight stable. Sleeps well most nights. Averages 11 hours - up and down during the night. Wakes up sometimes during the night and can't get back to sleep.  Focus and concentration stable. Completing tasks. Managing aspects of household.  Denies SI or HI. Denies AH or VH.  Review of Systems:  Review of Systems  Musculoskeletal: Negative for gait problem.  Neurological: Negative for tremors.  Psychiatric/Behavioral:       Please refer to HPI    Medications: I have reviewed the patient's current medications.  Current Outpatient Medications  Medication Sig Dispense Refill  . acetaminophen (TYLENOL) 325 MG tablet Take 2 tablets (650 mg total) by mouth every 6 (six) hours as needed for mild pain or moderate pain. 30 tablet 3  . alendronate  (FOSAMAX) 70 MG tablet Take 70 mg by mouth every morning.    Marland Kitchen ALPRAZolam (XANAX) 1 MG tablet Take 1/2 tablet three times daily and 1/2 tab extra for severe anxiety. 60 tablet 2  . busPIRone (BUSPAR) 15 MG tablet Take 1 tablet (15 mg total) by mouth 3 (three) times daily. 270 tablet 3  . diclofenac (VOLTAREN) 75 MG EC tablet Take 75 mg by mouth 2 (two) times daily.    Marland Kitchen ezetimibe (ZETIA) 10 MG tablet Take 10 mg by mouth daily.    Marland Kitchen FLUoxetine (PROZAC) 20 MG capsule Take 1 capsule (20 mg total) by mouth 3 (three) times daily. 270 capsule 3  . ipratropium (ATROVENT) 0.03 % nasal spray SPRAY 2 SPRAYS INTO EACH NOSTRIL TWICE A DAY    . lansoprazole (PREVACID SOLUTAB) 30 MG disintegrating tablet Take 1 tablet (30 mg total) by mouth daily. 21 tablet 1  . metoprolol (TOPROL-XL) 50 MG 24 hr tablet Take 50 mg by mouth daily.      . metoprolol tartrate (LOPRESSOR) 25 MG tablet TAKE 0.5 TABLETS (12.5 MG TOTAL) BY MOUTH 2 TIMES DAILY.    . nitrofurantoin, macrocrystal-monohydrate, (MACROBID) 100 MG capsule Take 100 mg by mouth 2 (two) times daily.    . pantoprazole (PROTONIX) 40 MG tablet TAKE 1 TABLET BY MOUTH EVERYDAY AT BEDTIME    . primidone (MYSOLINE) 50 MG tablet Take 50 mg by mouth 2 (two) times daily.    . rosuvastatin (CRESTOR) 10 MG tablet Take 10 mg by mouth daily.    . SYMBICORT 160-4.5 MCG/ACT  inhaler USE 2 INHALATIONS TWICE A DAY     No current facility-administered medications for this visit.    Medication Side Effects: None  Allergies:  Allergies  Allergen Reactions  . Hydroxyzine     Other reaction(s): Other (See Comments) Sleepiness Nightmares   . Prednisone Anxiety    Other reaction(s): Other (See Comments)  . Rivaroxaban Other (See Comments)    Bleeding Bleeding   . Clindamycin Diarrhea  . Dicyclomine     Other reaction(s): Other (See Comments) Patient doesn't recall side effect  . Doxepin   . Duloxetine     Other reaction(s): Other (See Comments) Patient doesn't  recall side effect  . Erythromycin Base Diarrhea  . Escitalopram     Other reaction(s): Other (See Comments) Intensifies ocd  . Flecainide     Other reaction(s): Other (See Comments) weakness  . Hydrocodone-Homatropine Other (See Comments)    sleepy  . Lurasidone     Other reaction(s): Other (See Comments) diskinesia  . Paroxetine Hcl     Other reaction(s): Other (See Comments) Patient doesn't recall side effect  . Tizanidine     Other reaction(s): Other (See Comments) Patient doesn't recall side effect  . Amoxicillin Rash  . Levofloxacin Diarrhea  . Metformin And Related Diarrhea    Past Medical History:  Diagnosis Date  . Anxiety   . Asthma   . Depression   . GERD (gastroesophageal reflux disease)   . Hypercholesterolemia   . Hypertension   . Obsessive compulsive disorder     Family History  Family history unknown: Yes    Social History   Socioeconomic History  . Marital status: Married    Spouse name: Not on file  . Number of children: Not on file  . Years of education: Not on file  . Highest education level: Not on file  Occupational History  . Not on file  Tobacco Use  . Smoking status: Never Smoker  . Smokeless tobacco: Never Used  Substance and Sexual Activity  . Alcohol use: No  . Drug use: No  . Sexual activity: Not on file  Other Topics Concern  . Not on file  Social History Narrative  . Not on file   Social Determinants of Health   Financial Resource Strain:   . Difficulty of Paying Living Expenses:   Food Insecurity:   . Worried About Programme researcher, broadcasting/film/video in the Last Year:   . Barista in the Last Year:   Transportation Needs:   . Freight forwarder (Medical):   Marland Kitchen Lack of Transportation (Non-Medical):   Physical Activity:   . Days of Exercise per Week:   . Minutes of Exercise per Session:   Stress:   . Feeling of Stress :   Social Connections:   . Frequency of Communication with Friends and Family:   . Frequency of  Social Gatherings with Friends and Family:   . Attends Religious Services:   . Active Member of Clubs or Organizations:   . Attends Banker Meetings:   Marland Kitchen Marital Status:   Intimate Partner Violence:   . Fear of Current or Ex-Partner:   . Emotionally Abused:   Marland Kitchen Physically Abused:   . Sexually Abused:     Past Medical History, Surgical history, Social history, and Family history were reviewed and updated as appropriate.   Please see review of systems for further details on the patient's review from today.   Objective:   Physical Exam:  There were no vitals taken for this visit.  Physical Exam Constitutional:      General: She is not in acute distress. Musculoskeletal:        General: No deformity.  Neurological:     Mental Status: She is alert and oriented to person, place, and time.     Coordination: Coordination normal.  Psychiatric:        Attention and Perception: Attention and perception normal. She does not perceive auditory or visual hallucinations.        Mood and Affect: Mood normal. Mood is not anxious or depressed. Affect is not labile, blunt, angry or inappropriate.        Speech: Speech normal.        Behavior: Behavior normal.        Thought Content: Thought content normal. Thought content is not paranoid or delusional. Thought content does not include homicidal or suicidal ideation. Thought content does not include homicidal or suicidal plan.        Cognition and Memory: Cognition and memory normal.        Judgment: Judgment normal.     Comments: Insight intact     Lab Review:     Component Value Date/Time   NA 136 02/16/2019 0553   K 3.7 02/16/2019 0553   CL 104 02/16/2019 0553   CO2 22 02/16/2019 0553   GLUCOSE 128 (H) 02/16/2019 0553   BUN 12 02/16/2019 0553   CREATININE 0.69 02/16/2019 0553   CALCIUM 8.1 (L) 02/16/2019 0553   PROT 6.7 02/15/2019 1251   ALBUMIN 3.6 02/15/2019 1251   AST 42 (H) 02/15/2019 1251   ALT 41 02/15/2019  1251   ALKPHOS 63 02/15/2019 1251   BILITOT 0.9 02/15/2019 1251   GFRNONAA >60 02/16/2019 0553   GFRAA >60 02/16/2019 0553       Component Value Date/Time   WBC 14.0 (H) 02/16/2019 0553   RBC 3.90 02/16/2019 0553   HGB 11.9 (L) 02/16/2019 0553   HCT 37.1 02/16/2019 0553   PLT 227 02/16/2019 0553   MCV 95.1 02/16/2019 0553   MCH 30.5 02/16/2019 0553   MCHC 32.1 02/16/2019 0553   RDW 12.6 02/16/2019 0553   LYMPHSABS 1.6 03/12/2011 1154   MONOABS 0.6 03/12/2011 1154   EOSABS 0.1 03/12/2011 1154   BASOSABS 0.0 03/12/2011 1154    No results found for: POCLITH, LITHIUM   No results found for: PHENYTOIN, PHENOBARB, VALPROATE, CBMZ   .res Assessment: Plan:    Plan:  1. Prozac 20mg  TID 2. Xanax 1mg  BID 3. Buspar 15mg  TID  RTC 6 months  Patient advised to contact office with any questions, adverse effects, or acute worsening in signs and symptoms.  Discussed potential benefits, risk, and side effects of benzodiazepines to include potential risk of tolerance and dependence, as well as possible drowsiness.  Advised patient not to drive if experiencing drowsiness and to take lowest possible effective dose to minimize risk of dependence and tolerance.  There are no diagnoses linked to this encounter.   Please see After Visit Summary for patient specific instructions.  No future appointments.  No orders of the defined types were placed in this encounter.   -------------------------------

## 2019-12-28 DIAGNOSIS — E669 Obesity, unspecified: Secondary | ICD-10-CM | POA: Insufficient documentation

## 2020-02-01 ENCOUNTER — Telehealth: Payer: Self-pay | Admitting: Adult Health

## 2020-02-02 NOTE — Telephone Encounter (Signed)
Patient called to discuss ongoing issues with memory. Symptoms present over past few years. Patient uncertain if due to high anxiety and getting overwhelmed or medication related. Would like to try and taper off of Xanax. This will be second attempt. First attempt unsuccessful due to increased anxiety and panic. Discussed instructions for taper.

## 2020-02-09 ENCOUNTER — Other Ambulatory Visit: Payer: Self-pay

## 2020-02-09 MED ORDER — ALPRAZOLAM 0.25 MG PO TABS: ORAL_TABLET | ORAL | 0 refills | Status: DC

## 2020-02-26 ENCOUNTER — Telehealth: Payer: Self-pay | Admitting: Adult Health

## 2020-02-26 NOTE — Telephone Encounter (Signed)
Pt called to report status on decreasing Xanax. She is reporting some mild stomach problems and nervousness. Just wanted to touch base.Can LM.

## 2020-02-26 NOTE — Telephone Encounter (Signed)
Noted  

## 2020-03-18 ENCOUNTER — Other Ambulatory Visit: Payer: Self-pay | Admitting: Adult Health

## 2020-03-18 DIAGNOSIS — F411 Generalized anxiety disorder: Secondary | ICD-10-CM

## 2020-03-18 DIAGNOSIS — F410 Panic disorder [episodic paroxysmal anxiety] without agoraphobia: Secondary | ICD-10-CM

## 2020-03-18 MED ORDER — ALPRAZOLAM 1 MG PO TABS: ORAL_TABLET | ORAL | 2 refills | Status: DC

## 2020-06-11 ENCOUNTER — Encounter: Payer: Self-pay | Admitting: Adult Health

## 2020-06-11 ENCOUNTER — Ambulatory Visit (INDEPENDENT_AMBULATORY_CARE_PROVIDER_SITE_OTHER): Payer: Medicare HMO | Admitting: Adult Health

## 2020-06-11 ENCOUNTER — Other Ambulatory Visit: Payer: Self-pay

## 2020-06-11 DIAGNOSIS — F331 Major depressive disorder, recurrent, moderate: Secondary | ICD-10-CM | POA: Diagnosis not present

## 2020-06-11 DIAGNOSIS — F410 Panic disorder [episodic paroxysmal anxiety] without agoraphobia: Secondary | ICD-10-CM

## 2020-06-11 DIAGNOSIS — F411 Generalized anxiety disorder: Secondary | ICD-10-CM

## 2020-06-11 DIAGNOSIS — F429 Obsessive-compulsive disorder, unspecified: Secondary | ICD-10-CM | POA: Diagnosis not present

## 2020-06-11 MED ORDER — ALPRAZOLAM 1 MG PO TABS: ORAL_TABLET | ORAL | 2 refills | Status: DC

## 2020-06-11 NOTE — Progress Notes (Signed)
Kayla Beard [id number] Jul 28, 1946 74 y.o.  Subjective:   Patient ID:  Kayla Beard is a 74 y.o. (DOB 07/04/1946) female.  Chief Complaint: No chief complaint on file.   HPI Kayla Beard presents to the office today for follow-up of anxiety, depression, OCD, and panic attacks.  Describes mood today as "ok". Pleasant. Mood symptoms - reports depression, anxiety, and irritability - "having good and bad days". Recently lost great aunt - "we were really close". Staying in touch with sons - "they are busy". Still unsteady  on her feet - "physical handicaps are holding me back". Involved in church - "going when I can". Following up with PCP. Stable interest and motivation. Taking medications as prescribed.  Energy levels better some days than others. Active, does not have a regular exercise routine with current disabilities.  Enjoys some usual interests and activities. Married. Lives with husband of 48 years. Has 2 grown sons. Spending time with family and friends.  Appetite adequate. Weight stable. Sleeps well most nights. Averages 10 to 12 hours - wakes up most nights for a few hours and then gets back to sleep. Focus and concentration stable. Completing tasks. Managing aspects of household. Retired/disabled.  Denies SI or HI. Denies AH or VH.  Review of Systems:  Review of Systems  Musculoskeletal: Negative for gait problem.  Neurological: Negative for tremors.  Psychiatric/Behavioral:       Please refer to HPI    Medications: I have reviewed the patient's current medications.  Current Outpatient Medications  Medication Sig Dispense Refill  . acetaminophen (TYLENOL) 325 MG tablet Take 2 tablets (650 mg total) by mouth every 6 (six) hours as needed for mild pain or moderate pain. 30 tablet 3  . alendronate (FOSAMAX) 70 MG tablet Take 70 mg by mouth every morning.    Marland Kitchen ALPRAZolam (XANAX) 1 MG tablet TAKE 1/2 TABLET THREE TIMES DAILY AND 1/2 TAB EXTRA FOR SEVERE ANXIETY. 60 tablet 2  .  ALPRAZolam (XANAX) 1 MG tablet Take 1/2 tablet three times daily and 1/2 tab extra for severe anxiety. 60 tablet 2  . busPIRone (BUSPAR) 15 MG tablet Take 1 tablet (15 mg total) by mouth 3 (three) times daily. 270 tablet 3  . diclofenac (VOLTAREN) 75 MG EC tablet Take 75 mg by mouth 2 (two) times daily.    Marland Kitchen ezetimibe (ZETIA) 10 MG tablet Take 10 mg by mouth daily.    Marland Kitchen FLUoxetine (PROZAC) 20 MG capsule Take 1 capsule (20 mg total) by mouth 3 (three) times daily. 270 capsule 3  . ipratropium (ATROVENT) 0.03 % nasal spray SPRAY 2 SPRAYS INTO EACH NOSTRIL TWICE A DAY    . lansoprazole (PREVACID SOLUTAB) 30 MG disintegrating tablet Take 1 tablet (30 mg total) by mouth daily. 21 tablet 1  . metoprolol (TOPROL-XL) 50 MG 24 hr tablet Take 50 mg by mouth daily.      . metoprolol tartrate (LOPRESSOR) 25 MG tablet TAKE 0.5 TABLETS (12.5 MG TOTAL) BY MOUTH 2 TIMES DAILY.    . nitrofurantoin, macrocrystal-monohydrate, (MACROBID) 100 MG capsule Take 100 mg by mouth 2 (two) times daily.    . pantoprazole (PROTONIX) 40 MG tablet TAKE 1 TABLET BY MOUTH EVERYDAY AT BEDTIME    . predniSONE (DELTASONE) 5 MG tablet TAKE 1 TABLET 3 TIMES A DAY X 5 DAYS THEN TWICE DAILY X 5 DAYS THEN 1 TAB EVERY DAY    . primidone (MYSOLINE) 50 MG tablet Take 50 mg by mouth 2 (two) times daily.    Marland Kitchen  rosuvastatin (CRESTOR) 10 MG tablet Take 10 mg by mouth daily.    . SYMBICORT 160-4.5 MCG/ACT inhaler USE 2 INHALATIONS TWICE A DAY     No current facility-administered medications for this visit.    Medication Side Effects: None  Allergies:  Allergies  Allergen Reactions  . Hydroxyzine     Other reaction(s): Other (See Comments) Sleepiness Nightmares   . Prednisone Anxiety    Other reaction(s): Other (See Comments)  . Rivaroxaban Other (See Comments)    Bleeding Bleeding   . Clindamycin Diarrhea  . Dicyclomine     Other reaction(s): Other (See Comments) Patient doesn't recall side effect  . Doxepin   . Duloxetine      Other reaction(s): Other (See Comments) Patient doesn't recall side effect  . Erythromycin Base Diarrhea  . Escitalopram     Other reaction(s): Other (See Comments) Intensifies ocd  . Flecainide     Other reaction(s): Other (See Comments) weakness  . Hydrocodone-Homatropine Other (See Comments)    sleepy  . Lurasidone     Other reaction(s): Other (See Comments) diskinesia  . Paroxetine Hcl     Other reaction(s): Other (See Comments) Patient doesn't recall side effect  . Tizanidine     Other reaction(s): Other (See Comments) Patient doesn't recall side effect  . Amoxicillin Rash  . Levofloxacin Diarrhea  . Metformin And Related Diarrhea    Past Medical History:  Diagnosis Date  . Anxiety   . Asthma   . Depression   . GERD (gastroesophageal reflux disease)   . Hypercholesterolemia   . Hypertension   . Obsessive compulsive disorder     Family History  Family history unknown: Yes    Social History   Socioeconomic History  . Marital status: Married    Spouse name: Not on file  . Number of children: Not on file  . Years of education: Not on file  . Highest education level: Not on file  Occupational History  . Not on file  Tobacco Use  . Smoking status: Never Smoker  . Smokeless tobacco: Never Used  Substance and Sexual Activity  . Alcohol use: No  . Drug use: No  . Sexual activity: Not on file  Other Topics Concern  . Not on file  Social History Narrative  . Not on file   Social Determinants of Health   Financial Resource Strain:   . Difficulty of Paying Living Expenses: Not on file  Food Insecurity:   . Worried About Programme researcher, broadcasting/film/video in the Last Year: Not on file  . Ran Out of Food in the Last Year: Not on file  Transportation Needs:   . Lack of Transportation (Medical): Not on file  . Lack of Transportation (Non-Medical): Not on file  Physical Activity:   . Days of Exercise per Week: Not on file  . Minutes of Exercise per Session: Not on file   Stress:   . Feeling of Stress : Not on file  Social Connections:   . Frequency of Communication with Friends and Family: Not on file  . Frequency of Social Gatherings with Friends and Family: Not on file  . Attends Religious Services: Not on file  . Active Member of Clubs or Organizations: Not on file  . Attends Banker Meetings: Not on file  . Marital Status: Not on file  Intimate Partner Violence:   . Fear of Current or Ex-Partner: Not on file  . Emotionally Abused: Not on file  . Physically  Abused: Not on file  . Sexually Abused: Not on file    Past Medical History, Surgical history, Social history, and Family history were reviewed and updated as appropriate.   Please see review of systems for further details on the patient's review from today.   Objective:   Physical Exam:  There were no vitals taken for this visit.  Physical Exam Constitutional:      General: She is not in acute distress. Musculoskeletal:        General: No deformity.  Neurological:     Mental Status: She is alert and oriented to person, place, and time.     Coordination: Coordination normal.  Psychiatric:        Attention and Perception: Attention and perception normal. She does not perceive auditory or visual hallucinations.        Mood and Affect: Mood normal. Mood is not anxious or depressed. Affect is not labile, blunt, angry or inappropriate.        Speech: Speech normal.        Behavior: Behavior normal.        Thought Content: Thought content normal. Thought content is not paranoid or delusional. Thought content does not include homicidal or suicidal ideation. Thought content does not include homicidal or suicidal plan.        Cognition and Memory: Cognition and memory normal.        Judgment: Judgment normal.     Comments: Insight intact     Lab Review:     Component Value Date/Time   NA 136 02/16/2019 0553   K 3.7 02/16/2019 0553   CL 104 02/16/2019 0553   CO2 22  02/16/2019 0553   GLUCOSE 128 (H) 02/16/2019 0553   BUN 12 02/16/2019 0553   CREATININE 0.69 02/16/2019 0553   CALCIUM 8.1 (L) 02/16/2019 0553   PROT 6.7 02/15/2019 1251   ALBUMIN 3.6 02/15/2019 1251   AST 42 (H) 02/15/2019 1251   ALT 41 02/15/2019 1251   ALKPHOS 63 02/15/2019 1251   BILITOT 0.9 02/15/2019 1251   GFRNONAA >60 02/16/2019 0553   GFRAA >60 02/16/2019 0553       Component Value Date/Time   WBC 14.0 (H) 02/16/2019 0553   RBC 3.90 02/16/2019 0553   HGB 11.9 (L) 02/16/2019 0553   HCT 37.1 02/16/2019 0553   PLT 227 02/16/2019 0553   MCV 95.1 02/16/2019 0553   MCH 30.5 02/16/2019 0553   MCHC 32.1 02/16/2019 0553   RDW 12.6 02/16/2019 0553   LYMPHSABS 1.6 03/12/2011 1154   MONOABS 0.6 03/12/2011 1154   EOSABS 0.1 03/12/2011 1154   BASOSABS 0.0 03/12/2011 1154    No results found for: POCLITH, LITHIUM   No results found for: PHENYTOIN, PHENOBARB, VALPROATE, CBMZ   .res Assessment: Plan:    Plan:  1. Prozac 20mg  TID 2. Xanax 1mg  BID 3. Buspar 15mg  TID  RTC 6 months  Patient advised to contact office with any questions, adverse effects, or acute worsening in signs and symptoms.  Discussed potential benefits, risk, and side effects of benzodiazepines to include potential risk of tolerance and dependence, as well as possible drowsiness.  Advised patient not to drive if experiencing drowsiness and to take lowest possible effective dose to minimize risk of dependence and tolerance.   Diagnoses and all orders for this visit:  Obsessive-compulsive disorder, unspecified type  Generalized anxiety disorder -     ALPRAZolam (XANAX) 1 MG tablet; Take 1/2 tablet three times daily and 1/2 tab  extra for severe anxiety.  Panic attacks -     ALPRAZolam (XANAX) 1 MG tablet; Take 1/2 tablet three times daily and 1/2 tab extra for severe anxiety.  Major depressive disorder, recurrent episode, moderate (HCC)     Please see After Visit Summary for patient specific  instructions.  No future appointments.  No orders of the defined types were placed in this encounter.   -------------------------------

## 2020-10-14 ENCOUNTER — Other Ambulatory Visit: Payer: Self-pay | Admitting: Adult Health

## 2020-10-14 DIAGNOSIS — F411 Generalized anxiety disorder: Secondary | ICD-10-CM

## 2020-10-14 DIAGNOSIS — F410 Panic disorder [episodic paroxysmal anxiety] without agoraphobia: Secondary | ICD-10-CM

## 2020-12-10 ENCOUNTER — Encounter: Payer: Self-pay | Admitting: Adult Health

## 2020-12-10 ENCOUNTER — Other Ambulatory Visit: Payer: Self-pay

## 2020-12-10 ENCOUNTER — Ambulatory Visit (INDEPENDENT_AMBULATORY_CARE_PROVIDER_SITE_OTHER): Payer: Medicare HMO | Admitting: Adult Health

## 2020-12-10 DIAGNOSIS — F429 Obsessive-compulsive disorder, unspecified: Secondary | ICD-10-CM

## 2020-12-10 DIAGNOSIS — F410 Panic disorder [episodic paroxysmal anxiety] without agoraphobia: Secondary | ICD-10-CM

## 2020-12-10 DIAGNOSIS — F411 Generalized anxiety disorder: Secondary | ICD-10-CM | POA: Diagnosis not present

## 2020-12-10 DIAGNOSIS — F331 Major depressive disorder, recurrent, moderate: Secondary | ICD-10-CM

## 2020-12-10 MED ORDER — ALPRAZOLAM 1 MG PO TABS: ORAL_TABLET | ORAL | 2 refills | Status: DC

## 2020-12-10 NOTE — Progress Notes (Signed)
Kayla Beard [id number] 1945/12/03 75 y.o.  Subjective:   Patient ID:  Kayla Beard is a 75 y.o. (DOB 01-Dec-1945) female.  Chief Complaint: No chief complaint on file.   HPI Kayla Beard presents to the office today for follow-up of anxiety, depression, OCD, and panic attacks.  Describes mood today as "ok". Pleasant. Mood symptoms - reports some depression - "kind of down lately", anxiety - "some days", and irritability - "at times". Reports some worry and rumination. Stating "I don't know, but I don't feel happy". Talking with friends. Mostly staying at home - husband going to the store. Taking longer to walk and get places. Still unsteady on feet - denies any falls since last visit. Frustrated with disabilities. Active in church as able. Doing some writing. Following up with PCP. Stable interest and motivation. Taking medications as prescribed.  Energy levels staedy. Active, does not have a regular exercise routine with current disabilities.  Enjoys some usual interests and activities. Married. Lives with husband. Has 2 grown sons. Spending time with family and friends.  Appetite adequate. Weight loss 150's from the 160's. Sleeps well most nights. Averages 10 to 12 hours. Focus and concentration stable. Completing tasks. Managing aspects of household. Retired/disabled.  Denies SI or HI.  Denies AH or VH.   Review of Systems:  Review of Systems  Musculoskeletal: Negative for gait problem.  Neurological: Negative for tremors.  Psychiatric/Behavioral:       Please refer to HPI    Medications: I have reviewed the patient's current medications.  Current Outpatient Medications  Medication Sig Dispense Refill  . acetaminophen (TYLENOL) 325 MG tablet Take 2 tablets (650 mg total) by mouth every 6 (six) hours as needed for mild pain or moderate pain. 30 tablet 3  . alendronate (FOSAMAX) 70 MG tablet Take 70 mg by mouth every morning.    Marland Kitchen ALPRAZolam (XANAX) 1 MG tablet TAKE 1/2 TABLET THREE  TIMES DAILY AND 1/2 TAB EXTRA FOR SEVERE ANXIETY 60 tablet 2  . busPIRone (BUSPAR) 15 MG tablet Take 1 tablet (15 mg total) by mouth 3 (three) times daily. 270 tablet 3  . diclofenac (VOLTAREN) 75 MG EC tablet Take 75 mg by mouth 2 (two) times daily.    Marland Kitchen ezetimibe (ZETIA) 10 MG tablet Take 10 mg by mouth daily.    Marland Kitchen FLUoxetine (PROZAC) 20 MG capsule Take 1 capsule (20 mg total) by mouth 3 (three) times daily. 270 capsule 3  . ipratropium (ATROVENT) 0.03 % nasal spray SPRAY 2 SPRAYS INTO EACH NOSTRIL TWICE A DAY    . lansoprazole (PREVACID SOLUTAB) 30 MG disintegrating tablet Take 1 tablet (30 mg total) by mouth daily. 21 tablet 1  . metoprolol (TOPROL-XL) 50 MG 24 hr tablet Take 50 mg by mouth daily.      . metoprolol tartrate (LOPRESSOR) 25 MG tablet TAKE 0.5 TABLETS (12.5 MG TOTAL) BY MOUTH 2 TIMES DAILY.    . nitrofurantoin, macrocrystal-monohydrate, (MACROBID) 100 MG capsule Take 100 mg by mouth 2 (two) times daily.    . pantoprazole (PROTONIX) 40 MG tablet TAKE 1 TABLET BY MOUTH EVERYDAY AT BEDTIME    . predniSONE (DELTASONE) 5 MG tablet TAKE 1 TABLET 3 TIMES A DAY X 5 DAYS THEN TWICE DAILY X 5 DAYS THEN 1 TAB EVERY DAY    . primidone (MYSOLINE) 50 MG tablet Take 50 mg by mouth 2 (two) times daily.    . rosuvastatin (CRESTOR) 10 MG tablet Take 10 mg by mouth daily.    Marland Kitchen  SYMBICORT 160-4.5 MCG/ACT inhaler USE 2 INHALATIONS TWICE A DAY     No current facility-administered medications for this visit.    Medication Side Effects: None  Allergies:  Allergies  Allergen Reactions  . Hydroxyzine     Other reaction(s): Other (See Comments) Sleepiness Nightmares   . Prednisone Anxiety    Other reaction(s): Other (See Comments)  . Rivaroxaban Other (See Comments)    Bleeding Bleeding   . Clindamycin Diarrhea  . Dicyclomine     Other reaction(s): Other (See Comments) Patient doesn't recall side effect  . Doxepin   . Duloxetine     Other reaction(s): Other (See Comments) Patient  doesn't recall side effect  . Erythromycin Base Diarrhea  . Escitalopram     Other reaction(s): Other (See Comments) Intensifies ocd  . Flecainide     Other reaction(s): Other (See Comments) weakness  . Hydrocodone-Homatropine Other (See Comments)    sleepy  . Lurasidone     Other reaction(s): Other (See Comments) diskinesia  . Paroxetine Hcl     Other reaction(s): Other (See Comments) Patient doesn't recall side effect  . Tizanidine     Other reaction(s): Other (See Comments) Patient doesn't recall side effect  . Amoxicillin Rash  . Levofloxacin Diarrhea  . Metformin And Related Diarrhea    Past Medical History:  Diagnosis Date  . Anxiety   . Asthma   . Depression   . GERD (gastroesophageal reflux disease)   . Hypercholesterolemia   . Hypertension   . Obsessive compulsive disorder     Past Medical History, Surgical history, Social history, and Family history were reviewed and updated as appropriate.   Please see review of systems for further details on the patient's review from today.   Objective:   Physical Exam:  There were no vitals taken for this visit.  Physical Exam Constitutional:      General: She is not in acute distress. Musculoskeletal:        General: No deformity.  Neurological:     Mental Status: She is alert and oriented to person, place, and time.     Coordination: Coordination normal.  Psychiatric:        Attention and Perception: Attention and perception normal. She does not perceive auditory or visual hallucinations.        Mood and Affect: Mood normal. Mood is not anxious or depressed. Affect is not labile, blunt, angry or inappropriate.        Speech: Speech normal.        Behavior: Behavior normal.        Thought Content: Thought content normal. Thought content is not paranoid or delusional. Thought content does not include homicidal or suicidal ideation. Thought content does not include homicidal or suicidal plan.        Cognition and  Memory: Cognition and memory normal.        Judgment: Judgment normal.     Comments: Insight intact     Lab Review:     Component Value Date/Time   NA 136 02/16/2019 0553   K 3.7 02/16/2019 0553   CL 104 02/16/2019 0553   CO2 22 02/16/2019 0553   GLUCOSE 128 (H) 02/16/2019 0553   BUN 12 02/16/2019 0553   CREATININE 0.69 02/16/2019 0553   CALCIUM 8.1 (L) 02/16/2019 0553   PROT 6.7 02/15/2019 1251   ALBUMIN 3.6 02/15/2019 1251   AST 42 (H) 02/15/2019 1251   ALT 41 02/15/2019 1251   ALKPHOS 63 02/15/2019 1251  BILITOT 0.9 02/15/2019 1251   GFRNONAA >60 02/16/2019 0553   GFRAA >60 02/16/2019 0553       Component Value Date/Time   WBC 14.0 (H) 02/16/2019 0553   RBC 3.90 02/16/2019 0553   HGB 11.9 (L) 02/16/2019 0553   HCT 37.1 02/16/2019 0553   PLT 227 02/16/2019 0553   MCV 95.1 02/16/2019 0553   MCH 30.5 02/16/2019 0553   MCHC 32.1 02/16/2019 0553   RDW 12.6 02/16/2019 0553   LYMPHSABS 1.6 03/12/2011 1154   MONOABS 0.6 03/12/2011 1154   EOSABS 0.1 03/12/2011 1154   BASOSABS 0.0 03/12/2011 1154    No results found for: POCLITH, LITHIUM   No results found for: PHENYTOIN, PHENOBARB, VALPROATE, CBMZ   .res Assessment: Plan:    Plan:  1. Prozac 20mg  TID 2. Xanax 1mg  BID 3. Buspar 15mg  TID  RTC 6 months  Patient advised to contact office with any questions, adverse effects, or acute worsening in signs and symptoms.  Discussed potential benefits, risk, and side effects of benzodiazepines to include potential risk of tolerance and dependence, as well as possible drowsiness.  Advised patient not to drive if experiencing drowsiness and to take lowest possible effective dose to minimize risk of dependence and tolerance.    Diagnoses and all orders for this visit:  Obsessive-compulsive disorder, unspecified type  Major depressive disorder, recurrent episode, moderate (HCC)  Generalized anxiety disorder -     ALPRAZolam (XANAX) 1 MG tablet; TAKE 1/2 TABLET  THREE TIMES DAILY AND 1/2 TAB EXTRA FOR SEVERE ANXIETY  Panic attacks -     ALPRAZolam (XANAX) 1 MG tablet; TAKE 1/2 TABLET THREE TIMES DAILY AND 1/2 TAB EXTRA FOR SEVERE ANXIETY     Please see After Visit Summary for patient specific instructions.  No future appointments.  No orders of the defined types were placed in this encounter.   -------------------------------

## 2021-02-18 IMAGING — DX RIGHT WRIST - COMPLETE 3+ VIEW
3 series · 3 of 3 positions shown · non-contrast
Comparison: None.

CLINICAL DATA: Right hand and wrist pain.  Recent fall.

EXAM:
RIGHT WRIST - COMPLETE 3+ VIEW; RIGHT HAND - 2 VIEW

[wrist ap]
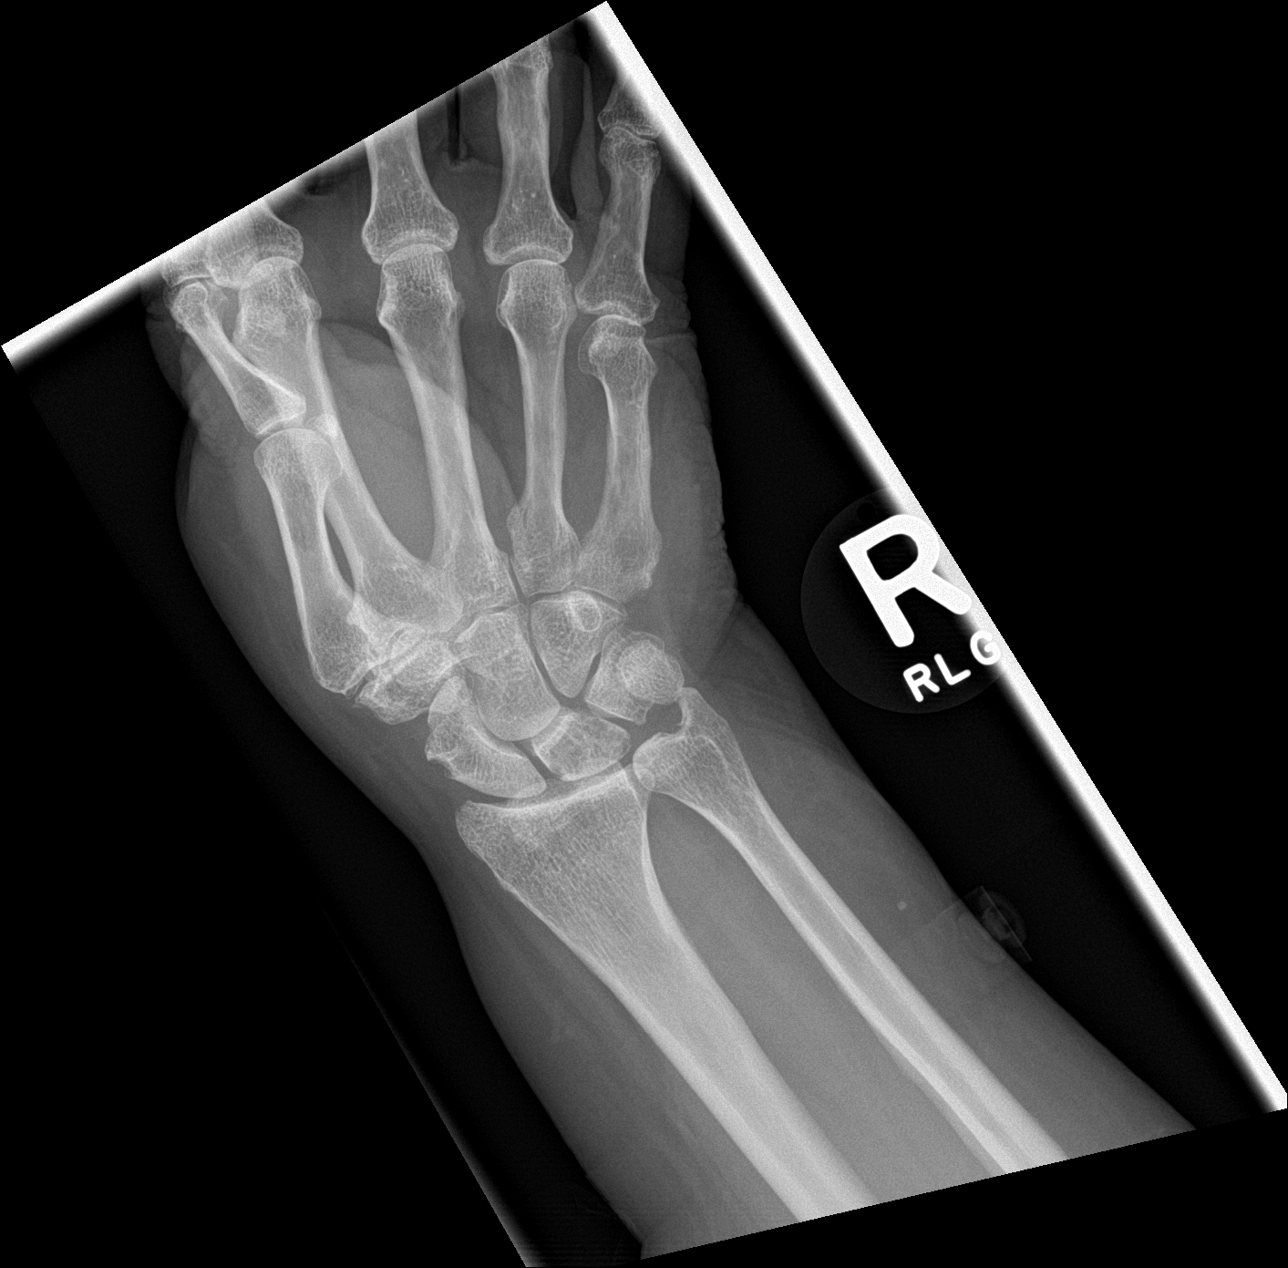

[wrist obl]
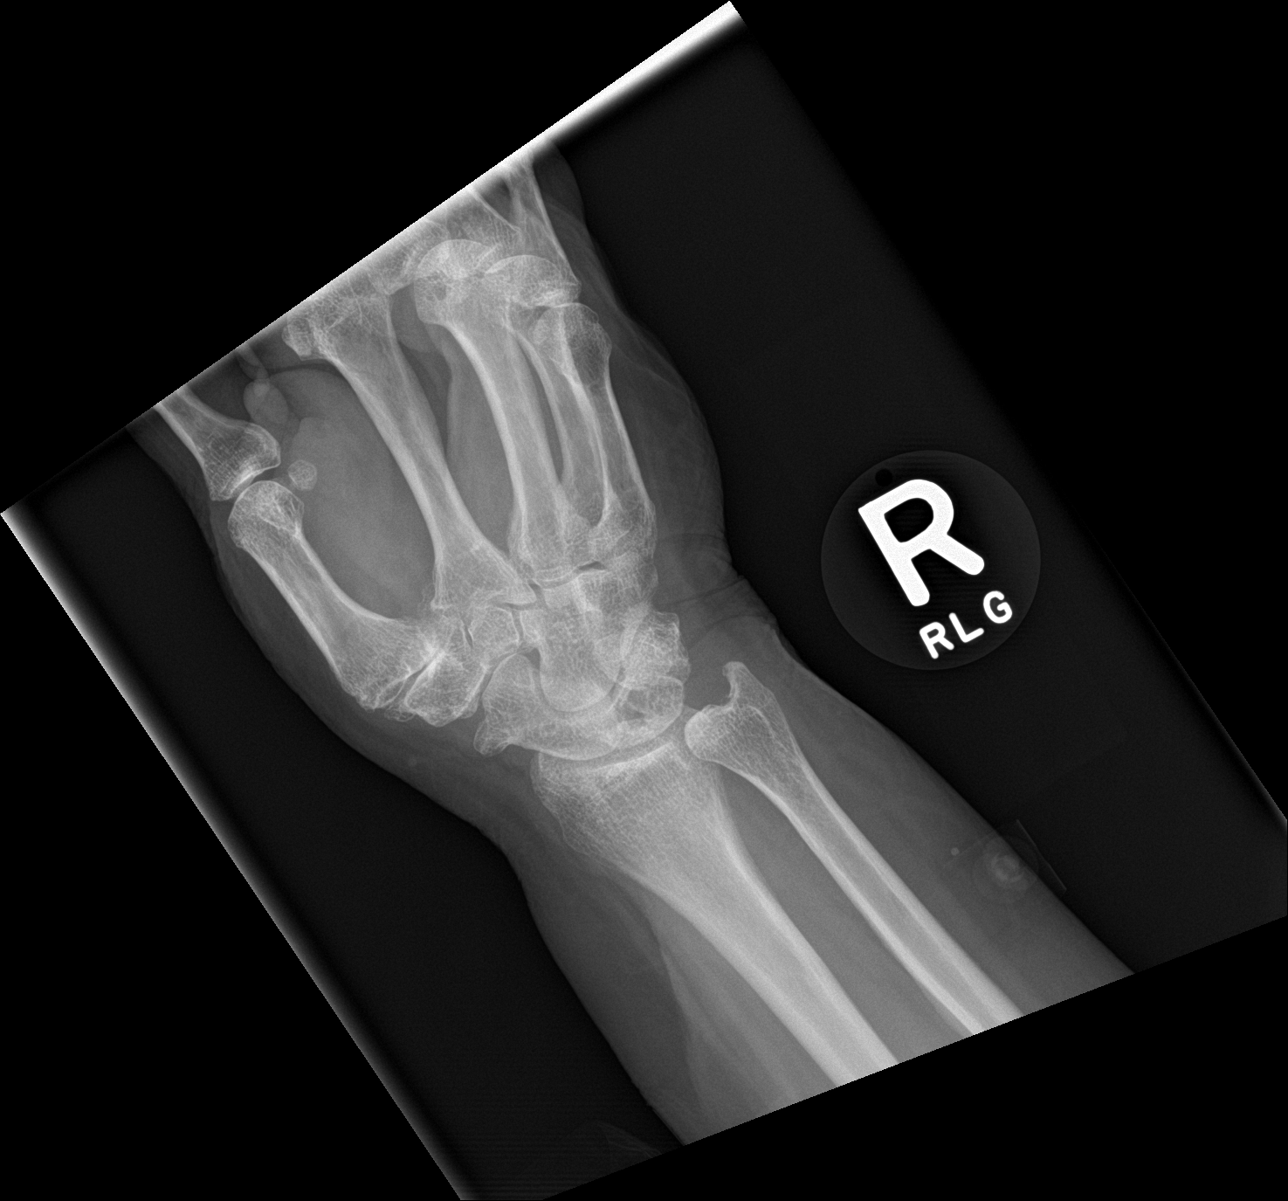

[wrist lat]
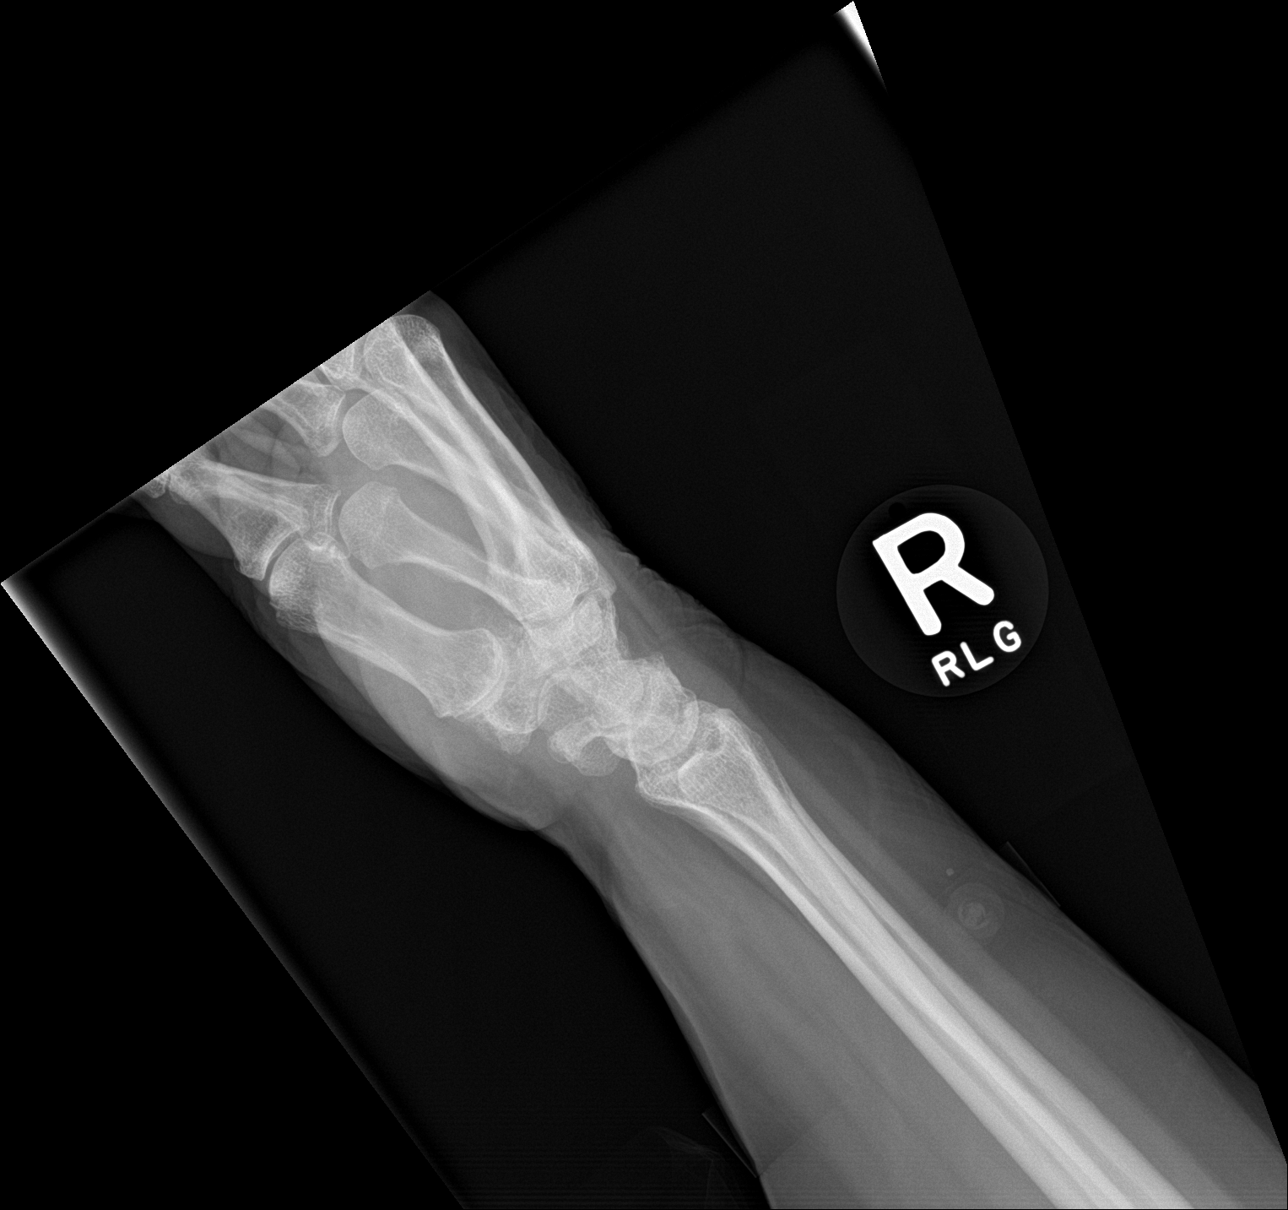

[3 of 3 positions shown; findings below may reference images not displayed]

FINDINGS: No acute fracture or dislocation. Moderate osteoarthritis of the
first CMC joint. Mild osteoarthritis of third DIP joint. Remaining
joint spaces are preserved. Bone mineralization is normal. Soft
tissues are unremarkable.
IMPRESSION: 1. No acute osseous abnormality of the right hand and wrist.

## 2021-02-18 IMAGING — DX RIGHT HAND - 2 VIEW
2 series · 2 of 2 positions shown · non-contrast
Comparison: None.

CLINICAL DATA: Right hand and wrist pain.  Recent fall.

EXAM:
RIGHT WRIST - COMPLETE 3+ VIEW; RIGHT HAND - 2 VIEW

[hand ap]
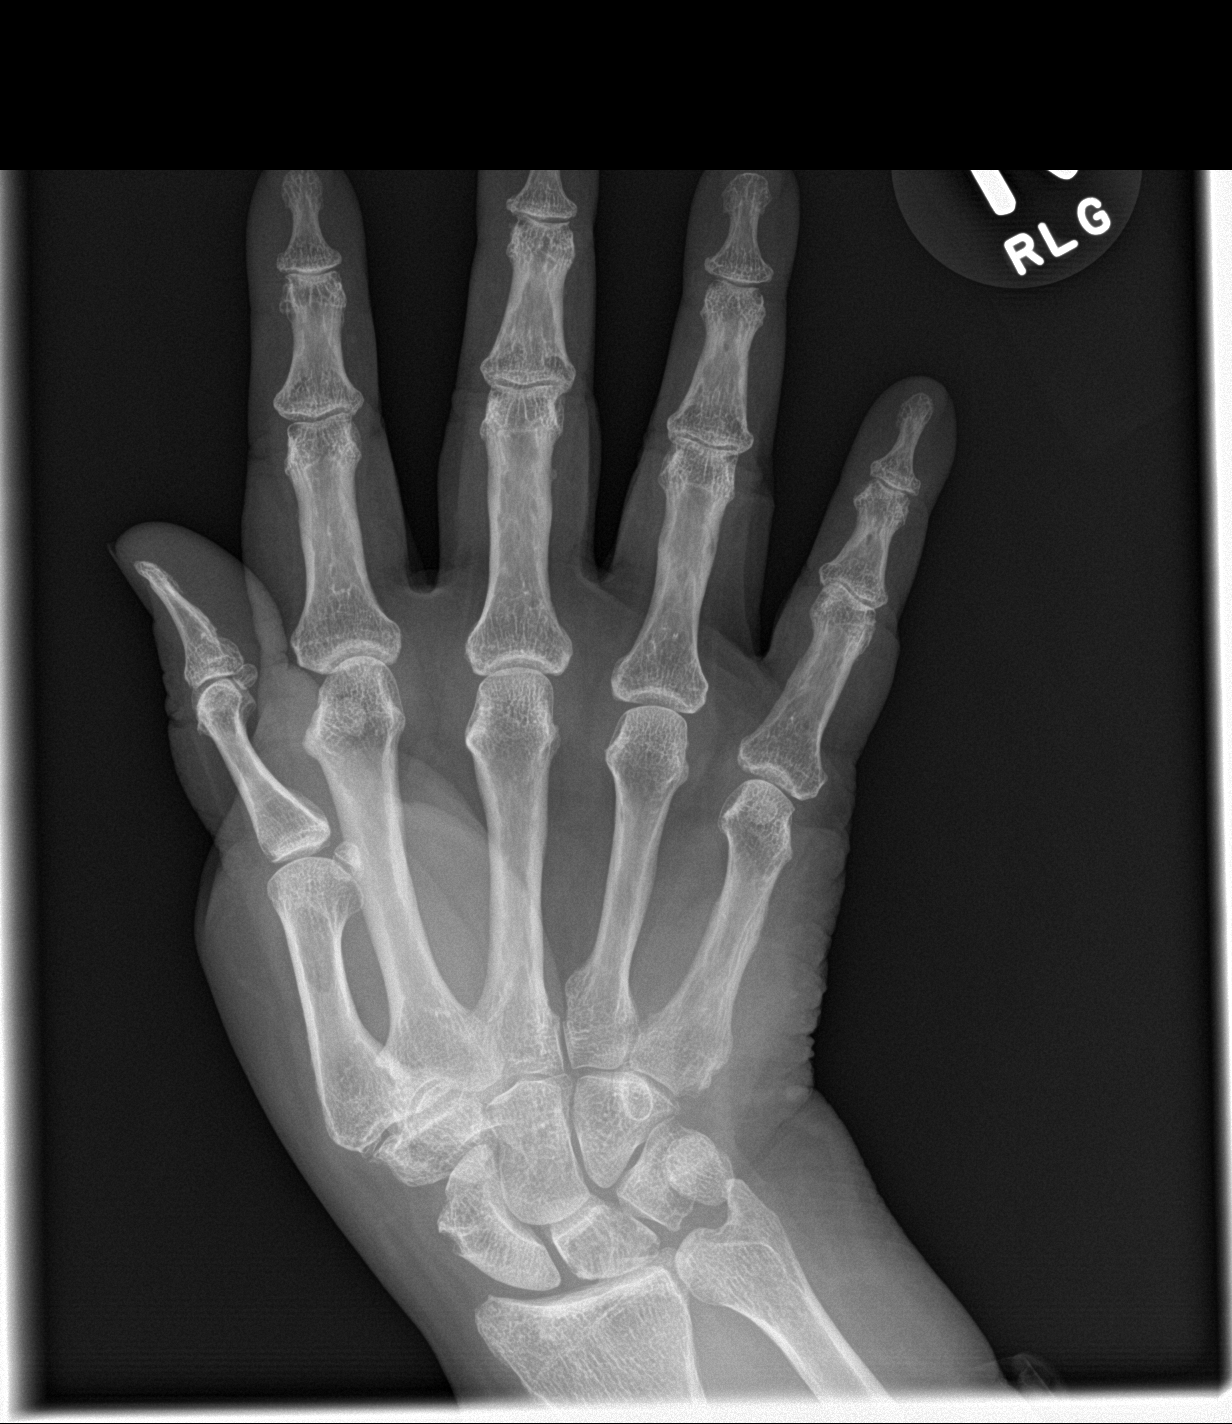

[hand lat]
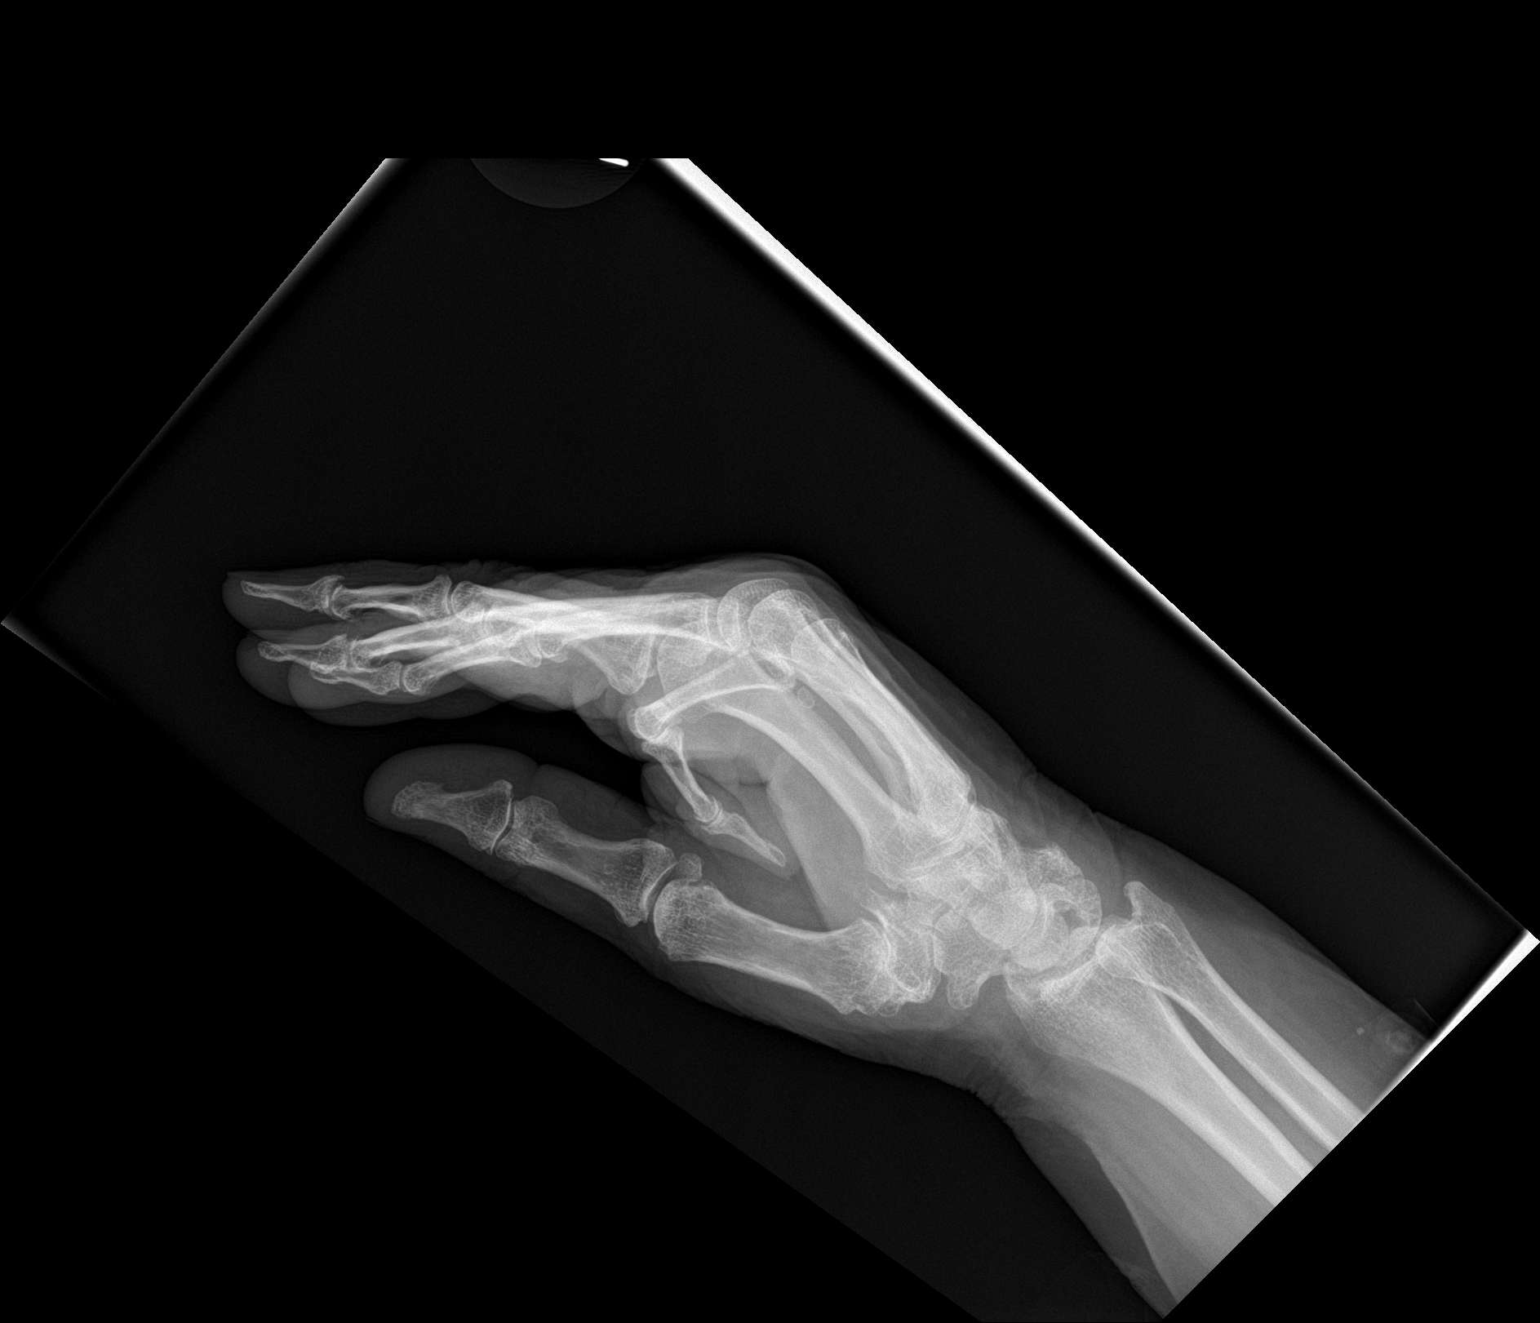

[2 of 2 positions shown; findings below may reference images not displayed]

FINDINGS: No acute fracture or dislocation. Moderate osteoarthritis of the
first CMC joint. Mild osteoarthritis of third DIP joint. Remaining
joint spaces are preserved. Bone mineralization is normal. Soft
tissues are unremarkable.
IMPRESSION: 1. No acute osseous abnormality of the right hand and wrist.

## 2021-02-20 ENCOUNTER — Other Ambulatory Visit: Payer: Self-pay | Admitting: Adult Health

## 2021-02-20 DIAGNOSIS — F429 Obsessive-compulsive disorder, unspecified: Secondary | ICD-10-CM

## 2021-02-20 DIAGNOSIS — F411 Generalized anxiety disorder: Secondary | ICD-10-CM

## 2021-02-20 DIAGNOSIS — F331 Major depressive disorder, recurrent, moderate: Secondary | ICD-10-CM

## 2021-02-28 ENCOUNTER — Telehealth: Payer: Self-pay | Admitting: Adult Health

## 2021-02-28 NOTE — Telephone Encounter (Signed)
Called and spoke with patient.

## 2021-02-28 NOTE — Telephone Encounter (Signed)
Pt called and said that she has been on xanax for years. She went to the cardiologist and told  him that she is tired all the time. She said that by 11 am she has to take a nap. The cardiologist said he thought it was from the xanax she takes 3 x d. She doesn't know what to do. She knows she can't stop it. Please give her a call with a solution at [phone]

## 2021-02-28 NOTE — Telephone Encounter (Signed)
Pt thinks her fatigue is coming from her xanax.She is not sure if that is the cause but her cardiologist agreed.She does not want to stop rx and wants to know if anything can be done

## 2021-03-29 ENCOUNTER — Other Ambulatory Visit: Payer: Self-pay | Admitting: Adult Health

## 2021-03-29 DIAGNOSIS — F411 Generalized anxiety disorder: Secondary | ICD-10-CM

## 2021-03-29 MED ORDER — BUSPIRONE HCL 15 MG PO TABS
15.0000 mg | ORAL_TABLET | Freq: Three times a day (TID) | ORAL | 3 refills | Status: DC
Start: 2021-03-29 — End: 2022-03-16

## 2021-05-27 DIAGNOSIS — H02831 Dermatochalasis of right upper eyelid: Secondary | ICD-10-CM | POA: Insufficient documentation

## 2021-05-27 DIAGNOSIS — H524 Presbyopia: Secondary | ICD-10-CM | POA: Insufficient documentation

## 2021-05-27 DIAGNOSIS — H02834 Dermatochalasis of left upper eyelid: Secondary | ICD-10-CM | POA: Insufficient documentation

## 2021-05-27 DIAGNOSIS — H5203 Hypermetropia, bilateral: Secondary | ICD-10-CM | POA: Insufficient documentation

## 2021-06-11 ENCOUNTER — Ambulatory Visit: Payer: Medicare HMO | Admitting: Adult Health

## 2021-06-20 ENCOUNTER — Other Ambulatory Visit: Payer: Self-pay

## 2021-06-20 ENCOUNTER — Encounter: Payer: Self-pay | Admitting: Adult Health

## 2021-06-20 ENCOUNTER — Ambulatory Visit: Payer: Medicare HMO | Admitting: Adult Health

## 2021-06-20 DIAGNOSIS — F331 Major depressive disorder, recurrent, moderate: Secondary | ICD-10-CM | POA: Diagnosis not present

## 2021-06-20 DIAGNOSIS — F411 Generalized anxiety disorder: Secondary | ICD-10-CM

## 2021-06-20 DIAGNOSIS — F429 Obsessive-compulsive disorder, unspecified: Secondary | ICD-10-CM | POA: Diagnosis not present

## 2021-06-20 DIAGNOSIS — F410 Panic disorder [episodic paroxysmal anxiety] without agoraphobia: Secondary | ICD-10-CM | POA: Diagnosis not present

## 2021-06-20 MED ORDER — ALPRAZOLAM 1 MG PO TABS: ORAL_TABLET | ORAL | 2 refills | Status: DC

## 2021-06-20 NOTE — Progress Notes (Signed)
Kayla Beard [id number] 30-May-1946 75 y.o.  Subjective:   Patient ID:  Kayla Beard is a 75 y.o. (DOB 1945/11/05) female.  Chief Complaint: No chief complaint on file.   HPI Kayla Beard presents to the office today for follow-up of anxiety, depression, OCD, and panic attacks.  Describes mood today as "ok". Pleasant. Mood symptoms - reports some depression, anxiety, and irritability. Reports some worry and rumination. Stating "I'm hanging in there". Feels like medications continue to work well. Concerned about health issues. PCP has referred her to neurology - Dr. Antonietta Barcelona. Gets frustrated with disabilities - always afraid she is going to fall - using a walker. Husband with health issues - doing better. Stable interest and motivation. Taking medications as prescribed. Energy levels vary. Active, does not have a regular exercise routine with current physical disabilities.  Enjoys some usual interests and activities. Married. Lives with husband. Has 2 grown sons. Spending time with family and friends.  Appetite adequate. Weight loss - 140's Sleeps well most nights. Averages 11 to 12 hours. Napping for 1 hour during the day. Focus and concentration stable. Completing tasks. Managing aspects of household. Retired/disabled.  Denies SI or HI.  Denies AH or VH.  Review of Systems:  Review of Systems  Musculoskeletal:  Negative for gait problem.  Neurological:  Negative for tremors.  Psychiatric/Behavioral:         Please refer to HPI   Medications: I have reviewed the patient's current medications.  Current Outpatient Medications  Medication Sig Dispense Refill   acetaminophen (TYLENOL) 325 MG tablet Take 2 tablets (650 mg total) by mouth every 6 (six) hours as needed for mild pain or moderate pain. 30 tablet 3   alendronate (FOSAMAX) 70 MG tablet Take 70 mg by mouth every morning.     ALPRAZolam (XANAX) 1 MG tablet TAKE 1/2 TABLET THREE TIMES DAILY AND 1/2 TAB EXTRA FOR SEVERE ANXIETY 60 tablet 2    busPIRone (BUSPAR) 15 MG tablet Take 1 tablet (15 mg total) by mouth 3 (three) times daily. 270 tablet 3   diclofenac (VOLTAREN) 75 MG EC tablet Take 75 mg by mouth 2 (two) times daily.     ezetimibe (ZETIA) 10 MG tablet Take 10 mg by mouth daily.     FLUoxetine (PROZAC) 20 MG capsule TAKE 1 CAPSULE (20 MG TOTAL) BY MOUTH 3 (THREE) TIMES DAILY. 270 capsule 3   ipratropium (ATROVENT) 0.03 % nasal spray SPRAY 2 SPRAYS INTO EACH NOSTRIL TWICE A DAY     lansoprazole (PREVACID SOLUTAB) 30 MG disintegrating tablet Take 1 tablet (30 mg total) by mouth daily. 21 tablet 1   metoprolol (TOPROL-XL) 50 MG 24 hr tablet Take 50 mg by mouth daily.       metoprolol tartrate (LOPRESSOR) 25 MG tablet TAKE 0.5 TABLETS (12.5 MG TOTAL) BY MOUTH 2 TIMES DAILY.     nitrofurantoin, macrocrystal-monohydrate, (MACROBID) 100 MG capsule Take 100 mg by mouth 2 (two) times daily.     pantoprazole (PROTONIX) 40 MG tablet TAKE 1 TABLET BY MOUTH EVERYDAY AT BEDTIME     predniSONE (DELTASONE) 5 MG tablet TAKE 1 TABLET 3 TIMES A DAY X 5 DAYS THEN TWICE DAILY X 5 DAYS THEN 1 TAB EVERY DAY     primidone (MYSOLINE) 50 MG tablet Take 50 mg by mouth 2 (two) times daily.     rosuvastatin (CRESTOR) 10 MG tablet Take 10 mg by mouth daily.     SYMBICORT 160-4.5 MCG/ACT inhaler USE 2 INHALATIONS TWICE A DAY  No current facility-administered medications for this visit.    Medication Side Effects: None  Allergies:  Allergies  Allergen Reactions   Hydroxyzine     Other reaction(s): Other (See Comments) Sleepiness Nightmares    Prednisone Anxiety    Other reaction(s): Other (See Comments)   Rivaroxaban Other (See Comments)    Bleeding Bleeding    Clindamycin Diarrhea   Dicyclomine     Other reaction(s): Other (See Comments) Patient doesn't recall side effect   Doxepin    Duloxetine     Other reaction(s): Other (See Comments) Patient doesn't recall side effect   Erythromycin Base Diarrhea   Escitalopram     Other  reaction(s): Other (See Comments) Intensifies ocd   Flecainide     Other reaction(s): Other (See Comments) weakness   Hydrocodone Bit-Homatrop Mbr Other (See Comments)    sleepy   Lurasidone     Other reaction(s): Other (See Comments) diskinesia   Paroxetine Hcl     Other reaction(s): Other (See Comments) Patient doesn't recall side effect   Tizanidine     Other reaction(s): Other (See Comments) Patient doesn't recall side effect   Amoxicillin Rash   Levofloxacin Diarrhea   Metformin And Related Diarrhea    Past Medical History:  Diagnosis Date   Anxiety    Asthma    Depression    GERD (gastroesophageal reflux disease)    Hypercholesterolemia    Hypertension    Obsessive compulsive disorder     Past Medical History, Surgical history, Social history, and Family history were reviewed and updated as appropriate.   Please see review of systems for further details on the patient's review from today.   Objective:   Physical Exam:  There were no vitals taken for this visit.  Physical Exam Constitutional:      General: She is not in acute distress. Musculoskeletal:        General: No deformity.  Neurological:     Mental Status: She is alert and oriented to person, place, and time.     Coordination: Coordination normal.  Psychiatric:        Attention and Perception: Attention and perception normal. She does not perceive auditory or visual hallucinations.        Mood and Affect: Mood normal. Mood is not anxious or depressed. Affect is not labile, blunt, angry or inappropriate.        Speech: Speech normal.        Behavior: Behavior normal.        Thought Content: Thought content normal. Thought content is not paranoid or delusional. Thought content does not include homicidal or suicidal ideation. Thought content does not include homicidal or suicidal plan.        Cognition and Memory: Cognition and memory normal.        Judgment: Judgment normal.     Comments: Insight  intact    Lab Review:     Component Value Date/Time   NA 136 02/16/2019 0553   K 3.7 02/16/2019 0553   CL 104 02/16/2019 0553   CO2 22 02/16/2019 0553   GLUCOSE 128 (H) 02/16/2019 0553   BUN 12 02/16/2019 0553   CREATININE 0.69 02/16/2019 0553   CALCIUM 8.1 (L) 02/16/2019 0553   PROT 6.7 02/15/2019 1251   ALBUMIN 3.6 02/15/2019 1251   AST 42 (H) 02/15/2019 1251   ALT 41 02/15/2019 1251   ALKPHOS 63 02/15/2019 1251   BILITOT 0.9 02/15/2019 1251   GFRNONAA >60 02/16/2019 0553  GFRAA >60 02/16/2019 0553       Component Value Date/Time   WBC 14.0 (H) 02/16/2019 0553   RBC 3.90 02/16/2019 0553   HGB 11.9 (L) 02/16/2019 0553   HCT 37.1 02/16/2019 0553   PLT 227 02/16/2019 0553   MCV 95.1 02/16/2019 0553   MCH 30.5 02/16/2019 0553   MCHC 32.1 02/16/2019 0553   RDW 12.6 02/16/2019 0553   LYMPHSABS 1.6 03/12/2011 1154   MONOABS 0.6 03/12/2011 1154   EOSABS 0.1 03/12/2011 1154   BASOSABS 0.0 03/12/2011 1154    No results found for: POCLITH, LITHIUM   No results found for: PHENYTOIN, PHENOBARB, VALPROATE, CBMZ   .res Assessment: Plan:    Plan:  1. Prozac 20mg  TID 2. Xanax 1mg  BID 3. Buspar 15mg  TID  RTC 6 months  Patient advised to contact office with any questions, adverse effects, or acute worsening in signs and symptoms.  Discussed potential benefits, risk, and side effects of benzodiazepines to include potential risk of tolerance and dependence, as well as possible drowsiness.  Advised patient not to drive if experiencing drowsiness and to take lowest possible effective dose to minimize risk of dependence and tolerance. Diagnoses and all orders for this visit:  Obsessive-compulsive disorder, unspecified type  Generalized anxiety disorder -     ALPRAZolam (XANAX) 1 MG tablet; TAKE 1/2 TABLET THREE TIMES DAILY AND 1/2 TAB EXTRA FOR SEVERE ANXIETY  Panic attacks -     ALPRAZolam (XANAX) 1 MG tablet; TAKE 1/2 TABLET THREE TIMES DAILY AND 1/2 TAB EXTRA FOR  SEVERE ANXIETY  Major depressive disorder, recurrent episode, moderate (HCC)    Please see After Visit Summary for patient specific instructions.  Future Appointments  Date Time Provider Department Center  12/18/2021  9:40 AM Janan Bogie, , NP [facility] None    No orders of the defined types were placed in this encounter.   -------------------------------

## 2021-08-29 ENCOUNTER — Telehealth: Payer: Self-pay | Admitting: Adult Health

## 2021-08-29 NOTE — Telephone Encounter (Signed)
Next  visit is 12/18/21. Ann called and has questions about a couple of her medications. Could someone please call her at [phone].

## 2021-08-29 NOTE — Telephone Encounter (Signed)
Called patient. She said she had changed manufacturer on one of her medications and she was more anxious. She decided that it was the holidays that had made her more anxious and she was feeling better now and would continue the medication.

## 2021-09-25 ENCOUNTER — Other Ambulatory Visit: Payer: Self-pay | Admitting: Adult Health

## 2021-09-25 DIAGNOSIS — F410 Panic disorder [episodic paroxysmal anxiety] without agoraphobia: Secondary | ICD-10-CM

## 2021-09-25 DIAGNOSIS — F411 Generalized anxiety disorder: Secondary | ICD-10-CM

## 2021-09-25 MED ORDER — ALPRAZOLAM 1 MG PO TABS: ORAL_TABLET | ORAL | 2 refills | Status: DC

## 2021-12-18 ENCOUNTER — Ambulatory Visit (INDEPENDENT_AMBULATORY_CARE_PROVIDER_SITE_OTHER): Payer: Medicare HMO | Admitting: Adult Health

## 2021-12-18 ENCOUNTER — Encounter: Payer: Self-pay | Admitting: Adult Health

## 2021-12-18 DIAGNOSIS — F410 Panic disorder [episodic paroxysmal anxiety] without agoraphobia: Secondary | ICD-10-CM

## 2021-12-18 DIAGNOSIS — F411 Generalized anxiety disorder: Secondary | ICD-10-CM | POA: Diagnosis not present

## 2021-12-18 DIAGNOSIS — F331 Major depressive disorder, recurrent, moderate: Secondary | ICD-10-CM

## 2021-12-18 DIAGNOSIS — F429 Obsessive-compulsive disorder, unspecified: Secondary | ICD-10-CM | POA: Diagnosis not present

## 2021-12-18 MED ORDER — ALPRAZOLAM 1 MG PO TABS: ORAL_TABLET | ORAL | 2 refills | Status: DC

## 2021-12-18 NOTE — Progress Notes (Signed)
Kayla Mulenne Hakanson ?[id number] ?02/05/1946 ?76 y.o. ? ?Subjective:  ? ?Patient ID:  Kayla Beard is a 76 y.o. (DOB 09/06/1945) female. ? ?Chief Complaint: No chief complaint on file. ? ? ?HPI ?Kayla Mulenne Sundeen presents to the office today for follow-up of anxiety, depression, OCD, and panic attacks. ? ?Describes mood today as "ok". Pleasant. Mood symptoms - denies depression, anxiety, and irritability. Reports some worry and rumination. Mood is consistent. Stating "I'm feeling pretty normal". Feels like medications continue to work well. Concerned about health issues. Appointment with Dr.Tonuzi on Dec 30, 2021. She and husband doing well - both with health issues. Stable interest and motivation. Taking medications as prescribed. ?Energy levels vary. Active, does not have a regular exercise routine with current physical disabilities.  ?Enjoys some usual interests and activities. Married. Lives with husband. Has 2 grown sons - grandchildren. Spending time with family and friends.  ?Appetite adequate. Weight loss - 140's ?Sleeps well most nights. Averages 10 to 12 hours a day. ?Focus and concentration stable. Completing tasks. Managing aspects of household. Retired/disabled.  ?Denies SI or HI.  ?Denies AH or VH. ? ?Review of Systems:  ?Review of Systems  ?Musculoskeletal:  Negative for gait problem.  ?Neurological:  Negative for tremors.  ?Psychiatric/Behavioral:    ?     Please refer to HPI  ? ?Medications: I have reviewed the patient's current medications. ? ?Current Outpatient Medications  ?Medication Sig Dispense Refill  ? acetaminophen (TYLENOL) 325 MG tablet Take 2 tablets (650 mg total) by mouth every 6 (six) hours as needed for mild pain or moderate pain. 30 tablet 3  ? alendronate (FOSAMAX) 70 MG tablet Take 70 mg by mouth every morning.    ? ALPRAZolam (XANAX) 1 MG tablet TAKE 1/2 TABLET THREE TIMES DAILY AND 1/2 TAB EXTRA FOR SEVERE ANXIETY 60 tablet 2  ? busPIRone (BUSPAR) 15 MG tablet Take 1 tablet (15 mg total) by mouth 3  (three) times daily. 270 tablet 3  ? diclofenac (VOLTAREN) 75 MG EC tablet Take 75 mg by mouth 2 (two) times daily.    ? ezetimibe (ZETIA) 10 MG tablet Take 10 mg by mouth daily.    ? FLUoxetine (PROZAC) 20 MG capsule TAKE 1 CAPSULE (20 MG TOTAL) BY MOUTH 3 (THREE) TIMES DAILY. 270 capsule 3  ? gabapentin (NEURONTIN) 300 MG capsule Take by mouth.    ? ipratropium (ATROVENT) 0.03 % nasal spray SPRAY 2 SPRAYS INTO EACH NOSTRIL TWICE A DAY    ? lansoprazole (PREVACID SOLUTAB) 30 MG disintegrating tablet Take 1 tablet (30 mg total) by mouth daily. 21 tablet 1  ? metoprolol (TOPROL-XL) 50 MG 24 hr tablet Take 50 mg by mouth daily.      ? metoprolol tartrate (LOPRESSOR) 25 MG tablet TAKE 0.5 TABLETS (12.5 MG TOTAL) BY MOUTH 2 TIMES DAILY.    ? nitrofurantoin, macrocrystal-monohydrate, (MACROBID) 100 MG capsule Take 100 mg by mouth 2 (two) times daily.    ? pantoprazole (PROTONIX) 40 MG tablet TAKE 1 TABLET BY MOUTH EVERYDAY AT BEDTIME    ? predniSONE (DELTASONE) 5 MG tablet TAKE 1 TABLET 3 TIMES A DAY X 5 DAYS THEN TWICE DAILY X 5 DAYS THEN 1 TAB EVERY DAY    ? primidone (MYSOLINE) 50 MG tablet Take 50 mg by mouth 2 (two) times daily.    ? rosuvastatin (CRESTOR) 10 MG tablet Take 10 mg by mouth daily.    ? SYMBICORT 160-4.5 MCG/ACT inhaler USE 2 INHALATIONS TWICE A DAY    ? ?No  current facility-administered medications for this visit.  ? ? ?Medication Side Effects: None ? ?Allergies:  ?Allergies  ?Allergen Reactions  ? Hydroxyzine   ?  Other reaction(s): Other (See Comments) ?Sleepiness ?Nightmares ?  ? Prednisone Anxiety  ?  Other reaction(s): Other (See Comments)  ? Rivaroxaban Other (See Comments)  ?  Bleeding ?Bleeding ?  ? Clindamycin Diarrhea  ? Dicyclomine   ?  Other reaction(s): Other (See Comments) ?Patient doesn't recall side effect  ? Doxepin   ? Duloxetine   ?  Other reaction(s): Other (See Comments) ?Patient doesn't recall side effect  ? Erythromycin Base Diarrhea  ? Escitalopram   ?  Other reaction(s): Other  (See Comments) ?Intensifies ocd  ? Flecainide   ?  Other reaction(s): Other (See Comments) ?weakness  ? Hydrocodone Bit-Homatrop Mbr Other (See Comments)  ?  sleepy  ? Lurasidone   ?  Other reaction(s): Other (See Comments) ?diskinesia  ? Paroxetine Hcl   ?  Other reaction(s): Other (See Comments) ?Patient doesn't recall side effect  ? Tizanidine   ?  Other reaction(s): Other (See Comments) ?Patient doesn't recall side effect  ? Amoxicillin Rash  ? Levofloxacin Diarrhea  ? Metformin And Related Diarrhea  ? ? ?Past Medical History:  ?Diagnosis Date  ? Anxiety   ? Asthma   ? Depression   ? GERD (gastroesophageal reflux disease)   ? Hypercholesterolemia   ? Hypertension   ? Obsessive compulsive disorder   ? ? ?Past Medical History, Surgical history, Social history, and Family history were reviewed and updated as appropriate.  ? ?Please see review of systems for further details on the patient's review from today.  ? ?Objective:  ? ?Physical Exam:  ?There were no vitals taken for this visit. ? ?Physical Exam ?Constitutional:   ?   General: She is not in acute distress. ?Musculoskeletal:     ?   General: No deformity.  ?Neurological:  ?   Mental Status: She is alert and oriented to person, place, and time.  ?   Coordination: Coordination normal.  ?Psychiatric:     ?   Attention and Perception: Attention and perception normal. She does not perceive auditory or visual hallucinations.     ?   Mood and Affect: Mood normal. Mood is not anxious or depressed. Affect is not labile, blunt, angry or inappropriate.     ?   Speech: Speech normal.     ?   Behavior: Behavior normal.     ?   Thought Content: Thought content normal. Thought content is not paranoid or delusional. Thought content does not include homicidal or suicidal ideation. Thought content does not include homicidal or suicidal plan.     ?   Cognition and Memory: Cognition and memory normal.     ?   Judgment: Judgment normal.  ?   Comments: Insight intact  ? ? ?Lab  Review:  ?   ?Component Value Date/Time  ? NA 136 02/16/2019 0553  ? K 3.7 02/16/2019 0553  ? CL 104 02/16/2019 0553  ? CO2 22 02/16/2019 0553  ? GLUCOSE 128 (H) 02/16/2019 0553  ? BUN 12 02/16/2019 0553  ? CREATININE 0.69 02/16/2019 0553  ? CALCIUM 8.1 (L) 02/16/2019 0553  ? PROT 6.7 02/15/2019 1251  ? ALBUMIN 3.6 02/15/2019 1251  ? AST 42 (H) 02/15/2019 1251  ? ALT 41 02/15/2019 1251  ? ALKPHOS 63 02/15/2019 1251  ? BILITOT 0.9 02/15/2019 1251  ? GFRNONAA >60 02/16/2019 0553  ? GFRAA >  60 02/16/2019 0553  ? ? ?   ?Component Value Date/Time  ? WBC 14.0 (H) 02/16/2019 0553  ? RBC 3.90 02/16/2019 0553  ? HGB 11.9 (L) 02/16/2019 0553  ? HCT 37.1 02/16/2019 0553  ? PLT 227 02/16/2019 0553  ? MCV 95.1 02/16/2019 0553  ? MCH 30.5 02/16/2019 0553  ? MCHC 32.1 02/16/2019 0553  ? RDW 12.6 02/16/2019 0553  ? LYMPHSABS 1.6 03/12/2011 1154  ? MONOABS 0.6 03/12/2011 1154  ? EOSABS 0.1 03/12/2011 1154  ? BASOSABS 0.0 03/12/2011 1154  ? ? ?No results found for: POCLITH, LITHIUM  ? ?No results found for: PHENYTOIN, PHENOBARB, VALPROATE, CBMZ  ? ?.res ?Assessment: Plan:   ? ?Plan: ? ?1. Prozac 20mg  TID ?2. Xanax 1mg  BID ?3. Buspar 15mg  TID ? ?RTC 6 months ? ?Patient advised to contact office with any questions, adverse effects, or acute worsening in signs and symptoms. ? ?Discussed potential benefits, risk, and side effects of benzodiazepines to include potential risk of tolerance and dependence, as well as possible drowsiness. Advised patient not to drive if experiencing drowsiness and to take lowest possible effective dose to minimize risk of dependence and tolerance. ? ?Diagnoses and all orders for this visit: ? ?Generalized anxiety disorder ?-     ALPRAZolam (XANAX) 1 MG tablet; TAKE 1/2 TABLET THREE TIMES DAILY AND 1/2 TAB EXTRA FOR SEVERE ANXIETY ? ?Panic attacks ?-     ALPRAZolam (XANAX) 1 MG tablet; TAKE 1/2 TABLET THREE TIMES DAILY AND 1/2 TAB EXTRA FOR SEVERE ANXIETY ? ?  ? ?Please see After Visit Summary for patient  specific instructions. ? ?No future appointments. ? ? ?No orders of the defined types were placed in this encounter. ? ? ?------------------------------- ?

## 2022-01-29 ENCOUNTER — Telehealth: Payer: Self-pay | Admitting: Adult Health

## 2022-01-29 NOTE — Telephone Encounter (Signed)
Pt has been having tremors in her arms.She stated in the mornings her teeth are chattering and bottom lip trembling.She is not sure what the cause is.She does not want to take anything strong

## 2022-01-29 NOTE — Telephone Encounter (Signed)
Called and spoke with patient.

## 2022-01-29 NOTE — Telephone Encounter (Signed)
Pt LVM @ 9:17a.  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  Next appt 11/6

## 2022-02-12 ENCOUNTER — Other Ambulatory Visit: Payer: Self-pay | Admitting: Adult Health

## 2022-02-12 DIAGNOSIS — G2401 Drug induced subacute dyskinesia: Secondary | ICD-10-CM

## 2022-02-12 MED ORDER — VALBENAZINE TOSYLATE 40 MG PO CAPS: 40.0000 mg | ORAL_CAPSULE | Freq: Every day | ORAL | 2 refills | Status: DC

## 2022-02-12 NOTE — Progress Notes (Signed)
Ingrezza

## 2022-02-18 ENCOUNTER — Telehealth: Payer: Self-pay

## 2022-02-18 NOTE — Telephone Encounter (Signed)
Prior Authorization submitted and approved for INGREZZA 40 MG effective 08/17/2021-08/16/2022 with [employer] Part D

## 2022-03-02 ENCOUNTER — Telehealth: Payer: Self-pay | Admitting: Adult Health

## 2022-03-02 NOTE — Telephone Encounter (Signed)
Pt LVM advising Rene Kocher she went to the doc at [facility] and they think her meds are ok.  They think she might have upset stomach and they want her to try a "gentler diet", cut out the Vitamin D and instead get an injection of Vitamin D (which she got today), drink Gatorade and Imodium and to call them back if she isn't feeling better by Fri.  She just wanted me to relay the message to [place].    Next appt 11/6

## 2022-03-02 NOTE — Telephone Encounter (Signed)
FYI

## 2022-03-02 NOTE — Telephone Encounter (Signed)
Noted. Ty!

## 2022-03-06 ENCOUNTER — Other Ambulatory Visit: Payer: Self-pay | Admitting: Adult Health

## 2022-03-06 DIAGNOSIS — F411 Generalized anxiety disorder: Secondary | ICD-10-CM

## 2022-03-06 DIAGNOSIS — F331 Major depressive disorder, recurrent, moderate: Secondary | ICD-10-CM

## 2022-03-06 DIAGNOSIS — F429 Obsessive-compulsive disorder, unspecified: Secondary | ICD-10-CM

## 2022-03-12 ENCOUNTER — Telehealth: Payer: Self-pay

## 2022-03-12 NOTE — Telephone Encounter (Signed)
Contacted pt yesterday 03/11/2022, about her medication Ingrezza 40 mg, she reports she stopped taking it because it caused her to clench her teeth and worsened her hand tremors. Pt reports it would wake her up early in the morning around 3 AM, with the clenching of her teeth then she would have to take Alprazolam to get back to sleep. She reports talking to [place] about it and they agreed for her to stop right now. Pt reported to me she had received paper work from [employer] that paid for her medication with a grant. Informed her she didn't need to fill out any forms but she could hold on to them for right now. She also reported she had 4 Ingrezza 40 mg capsules left, but after speaking with [facility] she should have a lot more left because she only received the medication around 02/27/2022. I am not sure if she was taking more than instructed. I did inform pharmacy to not refill any medication at this time but if it was restarted we would update them.    Will inform Rene Kocher of information.

## 2022-03-12 NOTE — Telephone Encounter (Signed)
Noted. Ty!

## 2022-03-15 ENCOUNTER — Other Ambulatory Visit: Payer: Self-pay | Admitting: Adult Health

## 2022-03-15 DIAGNOSIS — F411 Generalized anxiety disorder: Secondary | ICD-10-CM

## 2022-04-02 ENCOUNTER — Other Ambulatory Visit: Payer: Self-pay | Admitting: Adult Health

## 2022-04-02 DIAGNOSIS — F410 Panic disorder [episodic paroxysmal anxiety] without agoraphobia: Secondary | ICD-10-CM

## 2022-04-02 DIAGNOSIS — F411 Generalized anxiety disorder: Secondary | ICD-10-CM

## 2022-04-02 NOTE — Telephone Encounter (Signed)
Due 8/19

## 2022-04-03 ENCOUNTER — Other Ambulatory Visit: Payer: Self-pay | Admitting: Adult Health

## 2022-04-03 DIAGNOSIS — F410 Panic disorder [episodic paroxysmal anxiety] without agoraphobia: Secondary | ICD-10-CM

## 2022-04-03 DIAGNOSIS — F411 Generalized anxiety disorder: Secondary | ICD-10-CM

## 2022-04-03 MED ORDER — ALPRAZOLAM 1 MG PO TABS: ORAL_TABLET | ORAL | 2 refills | Status: DC

## 2022-06-02 ENCOUNTER — Other Ambulatory Visit: Payer: Self-pay | Admitting: Adult Health

## 2022-06-02 DIAGNOSIS — F331 Major depressive disorder, recurrent, moderate: Secondary | ICD-10-CM

## 2022-06-02 DIAGNOSIS — F411 Generalized anxiety disorder: Secondary | ICD-10-CM

## 2022-06-02 DIAGNOSIS — F429 Obsessive-compulsive disorder, unspecified: Secondary | ICD-10-CM

## 2022-06-22 ENCOUNTER — Ambulatory Visit: Payer: Medicare HMO | Admitting: Adult Health

## 2022-06-26 ENCOUNTER — Encounter: Payer: Self-pay | Admitting: Adult Health

## 2022-06-26 ENCOUNTER — Ambulatory Visit: Payer: Medicare HMO | Admitting: Adult Health

## 2022-06-26 DIAGNOSIS — F429 Obsessive-compulsive disorder, unspecified: Secondary | ICD-10-CM | POA: Diagnosis not present

## 2022-06-26 DIAGNOSIS — F410 Panic disorder [episodic paroxysmal anxiety] without agoraphobia: Secondary | ICD-10-CM | POA: Diagnosis not present

## 2022-06-26 DIAGNOSIS — F411 Generalized anxiety disorder: Secondary | ICD-10-CM

## 2022-06-26 DIAGNOSIS — F331 Major depressive disorder, recurrent, moderate: Secondary | ICD-10-CM | POA: Diagnosis not present

## 2022-06-26 MED ORDER — ALPRAZOLAM 1 MG PO TABS: ORAL_TABLET | ORAL | 2 refills | Status: DC

## 2022-06-26 MED ORDER — BUSPIRONE HCL 15 MG PO TABS: 15.0000 mg | ORAL_TABLET | Freq: Three times a day (TID) | ORAL | 3 refills | Status: DC

## 2022-06-26 MED ORDER — FLUOXETINE HCL 20 MG PO CAPS: 20.0000 mg | ORAL_CAPSULE | Freq: Three times a day (TID) | ORAL | 3 refills | Status: DC

## 2022-06-26 NOTE — Progress Notes (Signed)
Kayla Beard [id number] 11-09-1945 76 y.o.  Subjective:   Patient ID:  Kayla Beard is a 76 y.o. (DOB [birth year]) female.  Chief Complaint: No chief complaint on file.   HPI Kayla Beard presents to the office today for follow-up of anxiety, depression, OCD, and panic attacks.  Describes mood today as "ok". Pleasant. Mood symptoms - reporting some depression - "I feel like I don't have anything to do". Feels irritable "sometimes" - "yelled" at husband once. Feels anxious at times. Reports some worry and rumination. Mood is consistent. Stating "I feel like the medications keep my stable". Concerned about health issues. Following up with health care providers. She and husband doing well - both with health issues. Stable interest and motivation. Taking medications as prescribed. Energy levels fluctuates. Active, does not have a regular exercise routine with current physical disabilities.  Enjoys some usual interests and activities. Married. Lives with husband. Has 2 grown sons - grandchildren. Spending time with family and friends. Involved with church. Appetite adequate. Weight loss - 140's Sleeps well most nights. Averages 12 hours a day. Focus and concentration stable. Completing tasks. Managing aspects of household. Retired/disabled.  Denies SI or HI.  Denies AH or VH.    Review of Systems:  Review of Systems  Musculoskeletal:  Negative for gait problem.  Neurological:  Negative for tremors.  Psychiatric/Behavioral:         Please refer to HPI    Medications: I have reviewed the patient's current medications.  Current Outpatient Medications  Medication Sig Dispense Refill   acetaminophen (TYLENOL) 325 MG tablet Take 2 tablets (650 mg total) by mouth every 6 (six) hours as needed for mild pain or moderate pain. 30 tablet 3   alendronate (FOSAMAX) 70 MG tablet Take 70 mg by mouth every morning.     ALPRAZolam (XANAX) 1 MG tablet TAKE 1/2 TABLET THREE TIMES DAILY AND 1/2 TAB EXTRA FOR  SEVERE ANXIETY 60 tablet 2   busPIRone (BUSPAR) 15 MG tablet TAKE 1 TABLET BY MOUTH THREE TIMES A DAY 270 tablet 0   diclofenac (VOLTAREN) 75 MG EC tablet Take 75 mg by mouth 2 (two) times daily.     ezetimibe (ZETIA) 10 MG tablet Take 10 mg by mouth daily.     FLUoxetine (PROZAC) 20 MG capsule TAKE 1 CAPSULE (20 MG TOTAL) BY MOUTH 3 (THREE) TIMES DAILY. 270 capsule 0   gabapentin (NEURONTIN) 300 MG capsule Take by mouth.     ipratropium (ATROVENT) 0.03 % nasal spray SPRAY 2 SPRAYS INTO EACH NOSTRIL TWICE A DAY     lansoprazole (PREVACID SOLUTAB) 30 MG disintegrating tablet Take 1 tablet (30 mg total) by mouth daily. 21 tablet 1   metoprolol (TOPROL-XL) 50 MG 24 hr tablet Take 50 mg by mouth daily.       metoprolol tartrate (LOPRESSOR) 25 MG tablet TAKE 0.5 TABLETS (12.5 MG TOTAL) BY MOUTH 2 TIMES DAILY.     nitrofurantoin, macrocrystal-monohydrate, (MACROBID) 100 MG capsule Take 100 mg by mouth 2 (two) times daily.     pantoprazole (PROTONIX) 40 MG tablet TAKE 1 TABLET BY MOUTH EVERYDAY AT BEDTIME     predniSONE (DELTASONE) 5 MG tablet TAKE 1 TABLET 3 TIMES A DAY X 5 DAYS THEN TWICE DAILY X 5 DAYS THEN 1 TAB EVERY DAY     primidone (MYSOLINE) 50 MG tablet Take 50 mg by mouth 2 (two) times daily.     rosuvastatin (CRESTOR) 10 MG tablet Take 10 mg by mouth daily.  SYMBICORT 160-4.5 MCG/ACT inhaler USE 2 INHALATIONS TWICE A DAY     valbenazine (INGREZZA) 40 MG capsule Take 1 capsule (40 mg total) by mouth daily. 30 capsule 2   No current facility-administered medications for this visit.    Medication Side Effects: None  Allergies:  Allergies  Allergen Reactions   Hydroxyzine     Other reaction(s): Other (See Comments) Sleepiness Nightmares    Prednisone Anxiety    Other reaction(s): Other (See Comments)   Rivaroxaban Other (See Comments)    Bleeding Bleeding    Clindamycin Diarrhea   Dicyclomine     Other reaction(s): Other (See Comments) Patient doesn't recall side effect    Doxepin    Duloxetine     Other reaction(s): Other (See Comments) Patient doesn't recall side effect   Erythromycin Base Diarrhea   Escitalopram     Other reaction(s): Other (See Comments) Intensifies ocd   Flecainide     Other reaction(s): Other (See Comments) weakness   Hydrocodone Bit-Homatrop Mbr Other (See Comments)    sleepy   Lurasidone     Other reaction(s): Other (See Comments) diskinesia   Paroxetine Hcl     Other reaction(s): Other (See Comments) Patient doesn't recall side effect   Tizanidine     Other reaction(s): Other (See Comments) Patient doesn't recall side effect   Amoxicillin Rash   Levofloxacin Diarrhea   Metformin And Related Diarrhea    Past Medical History:  Diagnosis Date   Anxiety    Asthma    Depression    GERD (gastroesophageal reflux disease)    Hypercholesterolemia    Hypertension    Obsessive compulsive disorder     Past Medical History, Surgical history, Social history, and Family history were reviewed and updated as appropriate.   Please see review of systems for further details on the patient's review from today.   Objective:   Physical Exam:  There were no vitals taken for this visit.  Physical Exam Constitutional:      General: She is not in acute distress. Musculoskeletal:        General: No deformity.  Neurological:     Mental Status: She is alert and oriented to person, place, and time.     Coordination: Coordination normal.  Psychiatric:        Attention and Perception: Attention and perception normal. She does not perceive auditory or visual hallucinations.        Mood and Affect: Mood normal. Mood is not anxious or depressed. Affect is not labile, blunt, angry or inappropriate.        Speech: Speech normal.        Behavior: Behavior normal.        Thought Content: Thought content normal. Thought content is not paranoid or delusional. Thought content does not include homicidal or suicidal ideation. Thought content  does not include homicidal or suicidal plan.        Cognition and Memory: Cognition and memory normal.        Judgment: Judgment normal.     Comments: Insight intact     Lab Review:     Component Value Date/Time   NA 136 02/16/2019 0553   K 3.7 02/16/2019 0553   CL 104 02/16/2019 0553   CO2 22 02/16/2019 0553   GLUCOSE 128 (H) 02/16/2019 0553   BUN 12 02/16/2019 0553   CREATININE 0.69 02/16/2019 0553   CALCIUM 8.1 (L) 02/16/2019 0553   PROT 6.7 02/15/2019 1251   ALBUMIN 3.6 02/15/2019  1251   AST 42 (H) 02/15/2019 1251   ALT 41 02/15/2019 1251   ALKPHOS 63 02/15/2019 1251   BILITOT 0.9 02/15/2019 1251   GFRNONAA >60 02/16/2019 0553   GFRAA >60 02/16/2019 0553       Component Value Date/Time   WBC 14.0 (H) 02/16/2019 0553   RBC 3.90 02/16/2019 0553   HGB 11.9 (L) 02/16/2019 0553   HCT 37.1 02/16/2019 0553   PLT 227 02/16/2019 0553   MCV 95.1 02/16/2019 0553   MCH 30.5 02/16/2019 0553   MCHC 32.1 02/16/2019 0553   RDW 12.6 02/16/2019 0553   LYMPHSABS 1.6 03/12/2011 1154   MONOABS 0.6 03/12/2011 1154   EOSABS 0.1 03/12/2011 1154   BASOSABS 0.0 03/12/2011 1154    No results found for: "POCLITH", "LITHIUM"   No results found for: "PHENYTOIN", "PHENOBARB", "VALPROATE", "CBMZ"   .res Assessment: Plan:    Plan:  1. Prozac 20mg  TID 2. Xanax 1mg  BID - taking 1/2 tablet TID and the 1/2 extra every few weeks. 3. Buspar 15mg  TID  RTC 6 months  Patient advised to contact office with any questions, adverse effects, or acute worsening in signs and symptoms.  Discussed potential benefits, risk, and side effects of benzodiazepines to include potential risk of tolerance and dependence, as well as possible drowsiness. Advised patient not to drive if experiencing drowsiness and to take lowest possible effective dose to minimize risk of dependence and tolerance.  There are no diagnoses linked to this encounter.   Please see After Visit Summary for patient specific  instructions.  Future Appointments  Date Time Provider Department Center  06/26/2022 11:40 AM Regene Mccarthy, , NP [facility] None    No orders of the defined types were placed in this encounter.   -------------------------------

## 2022-11-26 ENCOUNTER — Other Ambulatory Visit: Payer: Self-pay | Admitting: Adult Health

## 2022-11-26 DIAGNOSIS — F411 Generalized anxiety disorder: Secondary | ICD-10-CM

## 2022-11-26 DIAGNOSIS — F410 Panic disorder [episodic paroxysmal anxiety] without agoraphobia: Secondary | ICD-10-CM

## 2022-11-26 NOTE — Telephone Encounter (Signed)
Pt called for refill of Alprazolam to   [id number] [place], [place] - [address] [address] Phone: [phone]  Fax: [phone]    Next appt 5/10

## 2022-12-25 ENCOUNTER — Ambulatory Visit: Payer: Medicare HMO | Admitting: Adult Health

## 2022-12-25 ENCOUNTER — Encounter: Payer: Self-pay | Admitting: Adult Health

## 2022-12-25 DIAGNOSIS — F410 Panic disorder [episodic paroxysmal anxiety] without agoraphobia: Secondary | ICD-10-CM

## 2022-12-25 DIAGNOSIS — F411 Generalized anxiety disorder: Secondary | ICD-10-CM

## 2022-12-25 DIAGNOSIS — F331 Major depressive disorder, recurrent, moderate: Secondary | ICD-10-CM | POA: Diagnosis not present

## 2022-12-25 DIAGNOSIS — F429 Obsessive-compulsive disorder, unspecified: Secondary | ICD-10-CM

## 2022-12-25 MED ORDER — BUSPIRONE HCL 15 MG PO TABS
15.0000 mg | ORAL_TABLET | Freq: Two times a day (BID) | ORAL | 3 refills | Status: DC
Start: 2022-12-25 — End: 2023-06-28

## 2022-12-25 MED ORDER — ALPRAZOLAM 1 MG PO TABS
ORAL_TABLET | ORAL | 2 refills | Status: DC
Start: 2022-12-25 — End: 2023-03-26

## 2022-12-25 MED ORDER — FLUOXETINE HCL 20 MG PO CAPS
20.0000 mg | ORAL_CAPSULE | Freq: Three times a day (TID) | ORAL | 3 refills | Status: DC
Start: 2022-12-25 — End: 2023-09-15

## 2022-12-25 NOTE — Progress Notes (Signed)
Kayla Beard [id number] 09-23-1945 77 y.o.  Subjective:   Patient ID:  Kayla Beard is a 77 y.o. (DOB [birth year]) female.  Chief Complaint: No chief complaint on file.   HPI Kayla Beard presents to the office today for follow-up of anxiety, depression, OCD, and panic attacks.  Describes mood today as "ok". Pleasant. Mood symptoms - reporting some depression. Denies irritability.  Reports increased anxiety lately - situational stressors. Stating "lately, I've been feeling more nervous". Taking one Xanax in the morning and 1/2 tablet at bedtime - has reduced dose. Reports some worry, rumination, and over thinking. Mood is consistent. Stating "I'm trying to work on not worrying so much". Feels like medications are helpful. Concerned about health issues. Following up with health care providers. She and husband doing well. Stable interest and motivation. Taking medications as prescribed. Energy levels lower. Active, does not have a regular exercise routine with current physical disabilities.  Enjoys some usual interests and activities. Married. Lives with husband. Has 2 grown sons - grandchildren. Spending time with family and friends. Involved with church. Appetite adequate. Weight loss - 142 pounds.  Sleeps well most nights. Averages 9 hours a day. Reports daytime napping. Focus and concentration stable. Completing tasks. Managing aspects of household. Retired/disabled.  Denies SI or HI.  Denies AH or VH. Denies self harm. Denies substance use.  Review of Systems:  Review of Systems  Musculoskeletal:  Negative for gait problem.  Neurological:  Negative for tremors.  Psychiatric/Behavioral:         Please refer to HPI    Medications: I have reviewed the patient's current medications.  Current Outpatient Medications  Medication Sig Dispense Refill   acetaminophen (TYLENOL) 325 MG tablet Take 2 tablets (650 mg total) by mouth every 6 (six) hours as needed for mild pain or moderate pain. 30  tablet 3   alendronate (FOSAMAX) 70 MG tablet Take 70 mg by mouth every morning.     ALPRAZolam (XANAX) 1 MG tablet TAKE ONE TABLET IN THE MORNING AND 1/2 TAB AT BEDTIME. 45 tablet 2   busPIRone (BUSPAR) 15 MG tablet Take 1 tablet (15 mg total) by mouth 2 (two) times daily. 180 tablet 3   diclofenac (VOLTAREN) 75 MG EC tablet Take 75 mg by mouth 2 (two) times daily.     ezetimibe (ZETIA) 10 MG tablet Take 10 mg by mouth daily.     FLUoxetine (PROZAC) 20 MG capsule Take 1 capsule (20 mg total) by mouth 3 (three) times daily. 270 capsule 3   gabapentin (NEURONTIN) 300 MG capsule Take by mouth.     ipratropium (ATROVENT) 0.03 % nasal spray SPRAY 2 SPRAYS INTO EACH NOSTRIL TWICE A DAY     lansoprazole (PREVACID SOLUTAB) 30 MG disintegrating tablet Take 1 tablet (30 mg total) by mouth daily. 21 tablet 1   metoprolol (TOPROL-XL) 50 MG 24 hr tablet Take 50 mg by mouth daily.       metoprolol tartrate (LOPRESSOR) 25 MG tablet TAKE 0.5 TABLETS (12.5 MG TOTAL) BY MOUTH 2 TIMES DAILY.     nitrofurantoin, macrocrystal-monohydrate, (MACROBID) 100 MG capsule Take 100 mg by mouth 2 (two) times daily.     pantoprazole (PROTONIX) 40 MG tablet TAKE 1 TABLET BY MOUTH EVERYDAY AT BEDTIME     predniSONE (DELTASONE) 5 MG tablet TAKE 1 TABLET 3 TIMES A DAY X 5 DAYS THEN TWICE DAILY X 5 DAYS THEN 1 TAB EVERY DAY     primidone (MYSOLINE) 50 MG tablet Take 50 mg by  mouth 2 (two) times daily.     rosuvastatin (CRESTOR) 10 MG tablet Take 10 mg by mouth daily.     SYMBICORT 160-4.5 MCG/ACT inhaler USE 2 INHALATIONS TWICE A DAY     No current facility-administered medications for this visit.    Medication Side Effects: None  Allergies:  Allergies  Allergen Reactions   Hydroxyzine     Other reaction(s): Other (See Comments) Sleepiness Nightmares    Prednisone Anxiety    Other reaction(s): Other (See Comments)   Rivaroxaban Other (See Comments)    Bleeding Bleeding    Clindamycin Diarrhea   Dicyclomine      Other reaction(s): Other (See Comments) Patient doesn't recall side effect   Doxepin    Duloxetine     Other reaction(s): Other (See Comments) Patient doesn't recall side effect   Erythromycin Base Diarrhea   Escitalopram     Other reaction(s): Other (See Comments) Intensifies ocd   Flecainide     Other reaction(s): Other (See Comments) weakness   Hydrocodone Bit-Homatrop Mbr Other (See Comments)    sleepy   Lurasidone     Other reaction(s): Other (See Comments) diskinesia   Paroxetine Hcl     Other reaction(s): Other (See Comments) Patient doesn't recall side effect   Tizanidine     Other reaction(s): Other (See Comments) Patient doesn't recall side effect   Amoxicillin Rash   Levofloxacin Diarrhea   Metformin And Related Diarrhea    Past Medical History:  Diagnosis Date   Anxiety    Asthma    Depression    GERD (gastroesophageal reflux disease)    Hypercholesterolemia    Hypertension    Obsessive compulsive disorder     Past Medical History, Surgical history, Social history, and Family history were reviewed and updated as appropriate.   Please see review of systems for further details on the patient's review from today.   Objective:   Physical Exam:  There were no vitals taken for this visit.  Physical Exam Constitutional:      General: She is not in acute distress. Musculoskeletal:        General: No deformity.  Neurological:     Mental Status: She is alert and oriented to person, place, and time.     Coordination: Coordination normal.  Psychiatric:        Attention and Perception: Attention and perception normal. She does not perceive auditory or visual hallucinations.        Mood and Affect: Mood normal. Mood is not anxious or depressed. Affect is not labile, blunt, angry or inappropriate.        Speech: Speech normal.        Behavior: Behavior normal.        Thought Content: Thought content normal. Thought content is not paranoid or delusional.  Thought content does not include homicidal or suicidal ideation. Thought content does not include homicidal or suicidal plan.        Cognition and Memory: Cognition and memory normal.        Judgment: Judgment normal.     Comments: Insight intact     Lab Review:     Component Value Date/Time   NA 136 02/16/2019 0553   K 3.7 02/16/2019 0553   CL 104 02/16/2019 0553   CO2 22 02/16/2019 0553   GLUCOSE 128 (H) 02/16/2019 0553   BUN 12 02/16/2019 0553   CREATININE 0.69 02/16/2019 0553   CALCIUM 8.1 (L) 02/16/2019 0553   PROT 6.7 02/15/2019 1251  ALBUMIN 3.6 02/15/2019 1251   AST 42 (H) 02/15/2019 1251   ALT 41 02/15/2019 1251   ALKPHOS 63 02/15/2019 1251   BILITOT 0.9 02/15/2019 1251   GFRNONAA >60 02/16/2019 0553   GFRAA >60 02/16/2019 0553       Component Value Date/Time   WBC 14.0 (H) 02/16/2019 0553   RBC 3.90 02/16/2019 0553   HGB 11.9 (L) 02/16/2019 0553   HCT 37.1 02/16/2019 0553   PLT 227 02/16/2019 0553   MCV 95.1 02/16/2019 0553   MCH 30.5 02/16/2019 0553   MCHC 32.1 02/16/2019 0553   RDW 12.6 02/16/2019 0553   LYMPHSABS 1.6 03/12/2011 1154   MONOABS 0.6 03/12/2011 1154   EOSABS 0.1 03/12/2011 1154   BASOSABS 0.0 03/12/2011 1154    No results found for: "POCLITH", "LITHIUM"   No results found for: "PHENYTOIN", "PHENOBARB", "VALPROATE", "CBMZ"   .res Assessment: Plan:    Plan:  1. Prozac 20mg  TID 2. Decrease Xanax 1mg  BID to 1mg  in the morning and 3. Buspar 15mg  TID to BID 3. Decrease Buspar 15mg  TID to BID   RTC 3 months  Patient advised to contact office with any questions, adverse effects, or acute worsening in signs and symptoms.  Discussed potential benefits, risk, and side effects of benzodiazepines to include potential risk of tolerance and dependence, as well as possible drowsiness. Advised patient not to drive if experiencing drowsiness and to take lowest possible effective dose to minimize risk of dependence and tolerance.  Diagnoses and  all orders for this visit:  Generalized anxiety disorder -     ALPRAZolam (XANAX) 1 MG tablet; TAKE ONE TABLET IN THE MORNING AND 1/2 TAB AT BEDTIME. -     busPIRone (BUSPAR) 15 MG tablet; Take 1 tablet (15 mg total) by mouth 2 (two) times daily. -     FLUoxetine (PROZAC) 20 MG capsule; Take 1 capsule (20 mg total) by mouth 3 (three) times daily.  Panic attacks -     ALPRAZolam (XANAX) 1 MG tablet; TAKE ONE TABLET IN THE MORNING AND 1/2 TAB AT BEDTIME.  Major depressive disorder, recurrent episode, moderate (HCC) -     FLUoxetine (PROZAC) 20 MG capsule; Take 1 capsule (20 mg total) by mouth 3 (three) times daily.  Obsessive-compulsive disorder, unspecified type -     FLUoxetine (PROZAC) 20 MG capsule; Take 1 capsule (20 mg total) by mouth 3 (three) times daily.     Please see After Visit Summary for patient specific instructions.  No future appointments.   No orders of the defined types were placed in this encounter.   -------------------------------

## 2023-01-28 ENCOUNTER — Telehealth: Payer: Self-pay

## 2023-01-28 NOTE — Telephone Encounter (Signed)
I believe we have sample packs of the Austedo we could try before writing a prescription.

## 2023-01-28 NOTE — Telephone Encounter (Signed)
Continuation:  Patient reporting tremors in lips and left hand and reporting clicking of teeth. She reports trying Ingrezza in the past but said it made her nervous. She is asking your opinion on trying Austedo, but said she isn't sure she can afford it.  Reported taking 1/2 pill that she increased to 1 whole pill, but decreased back to 1/2 pill. Reported this was Prozac. I told her I thought she was talking about alprazolam and she said she was. She is taking 1 whole tablet QAM and 1/2 tab at bedtime. She wondered if this was causing the tremor. She also reported knee pain and wondered if the gabapentin could be causing the tremor. She said due to pain she is unable to get up from her chair to get walker and husband has to help her.

## 2023-01-29 NOTE — Telephone Encounter (Signed)
Neuro thought tremor was essential tremor and may benefit from OT. Did not seem debilitating.

## 2023-01-29 NOTE — Telephone Encounter (Signed)
Pulled Austedo samples and notified patient.

## 2023-03-26 ENCOUNTER — Encounter: Payer: Self-pay | Admitting: Adult Health

## 2023-03-26 ENCOUNTER — Ambulatory Visit: Payer: Medicare HMO | Admitting: Adult Health

## 2023-03-26 DIAGNOSIS — F410 Panic disorder [episodic paroxysmal anxiety] without agoraphobia: Secondary | ICD-10-CM

## 2023-03-26 DIAGNOSIS — F411 Generalized anxiety disorder: Secondary | ICD-10-CM | POA: Diagnosis not present

## 2023-03-26 MED ORDER — ALPRAZOLAM 1 MG PO TABS
ORAL_TABLET | ORAL | 2 refills | Status: DC
Start: 2023-03-26 — End: 2023-06-03

## 2023-03-26 NOTE — Progress Notes (Signed)
Kayla Beard [id number] 1945/09/01 77 y.o.  Subjective:   Patient ID:  Kayla Beard is a 77 y.o. (DOB [birth year]) female.  Chief Complaint: No chief complaint on file.   HPI Kayla Beard presents to the office today for follow-up of anxiety, depression, OCD, and panic attacks.  Describes mood today as "ok". Pleasant. Denies tearfulness. Mood symptoms - reporting periods of depression, but they pass - "stuff from the past". Denies irritability. Reports anxiety at times - situational stressors. Reports some worry, rumination, and over thinking. Reports some worry about her granddaughter attending college at [employer]. Mood is consistent. Stating "I'm trying to work on not worrying so much". Feels like medications are helpful. Following up with health care providers. She and husband doing well. Stable interest and motivation. Taking medications as prescribed. Energy levels lower. Active, does not have a regular exercise routine with current physical disabilities.  Enjoys some usual interests and activities. Married. Lives with husband. Has 2 grown sons - grandchildren. Spending time with family and friends. Involved with church. Appetite adequate. Weight gain - 142 to 147 pounds.  Sleeps well most nights. Averages 12 hours a day. Reports some daytime napping. Focus and concentration stable. Reading. Recently wrote an article for the church. Completing tasks. Managing aspects of household. Retired/disabled.  Denies SI or HI.  Denies AH or VH. Denies self harm. Denies substance use.   Review of Systems:  Review of Systems  Musculoskeletal:  Negative for gait problem.  Neurological:  Negative for tremors.  Psychiatric/Behavioral:         Please refer to HPI    Medications: I have reviewed the patient's current medications.  Current Outpatient Medications  Medication Sig Dispense Refill   acetaminophen (TYLENOL) 325 MG tablet Take 2 tablets (650 mg total) by mouth every 6 (six) hours as  needed for mild pain or moderate pain. 30 tablet 3   alendronate (FOSAMAX) 70 MG tablet Take 70 mg by mouth every morning.     ALPRAZolam (XANAX) 1 MG tablet TAKE ONE TABLET IN THE MORNING AND 1/2 TAB AT BEDTIME. 45 tablet 2   busPIRone (BUSPAR) 15 MG tablet Take 1 tablet (15 mg total) by mouth 2 (two) times daily. 180 tablet 3   diclofenac (VOLTAREN) 75 MG EC tablet Take 75 mg by mouth 2 (two) times daily.     ezetimibe (ZETIA) 10 MG tablet Take 10 mg by mouth daily.     FLUoxetine (PROZAC) 20 MG capsule Take 1 capsule (20 mg total) by mouth 3 (three) times daily. 270 capsule 3   gabapentin (NEURONTIN) 300 MG capsule Take by mouth.     ipratropium (ATROVENT) 0.03 % nasal spray SPRAY 2 SPRAYS INTO EACH NOSTRIL TWICE A DAY     lansoprazole (PREVACID SOLUTAB) 30 MG disintegrating tablet Take 1 tablet (30 mg total) by mouth daily. 21 tablet 1   metoprolol (TOPROL-XL) 50 MG 24 hr tablet Take 50 mg by mouth daily.       metoprolol tartrate (LOPRESSOR) 25 MG tablet TAKE 0.5 TABLETS (12.5 MG TOTAL) BY MOUTH 2 TIMES DAILY.     nitrofurantoin, macrocrystal-monohydrate, (MACROBID) 100 MG capsule Take 100 mg by mouth 2 (two) times daily.     pantoprazole (PROTONIX) 40 MG tablet TAKE 1 TABLET BY MOUTH EVERYDAY AT BEDTIME     predniSONE (DELTASONE) 5 MG tablet TAKE 1 TABLET 3 TIMES A DAY X 5 DAYS THEN TWICE DAILY X 5 DAYS THEN 1 TAB EVERY DAY     primidone (MYSOLINE) 50  MG tablet Take 50 mg by mouth 2 (two) times daily.     rosuvastatin (CRESTOR) 10 MG tablet Take 10 mg by mouth daily.     SYMBICORT 160-4.5 MCG/ACT inhaler USE 2 INHALATIONS TWICE A DAY     No current facility-administered medications for this visit.    Medication Side Effects: None  Allergies:  Allergies  Allergen Reactions   Hydroxyzine     Other reaction(s): Other (See Comments) Sleepiness Nightmares    Prednisone Anxiety    Other reaction(s): Other (See Comments)   Rivaroxaban Other (See Comments)    Bleeding Bleeding     Clindamycin Diarrhea   Dicyclomine     Other reaction(s): Other (See Comments) Patient doesn't recall side effect   Doxepin    Duloxetine     Other reaction(s): Other (See Comments) Patient doesn't recall side effect   Erythromycin Base Diarrhea   Escitalopram     Other reaction(s): Other (See Comments) Intensifies ocd   Flecainide     Other reaction(s): Other (See Comments) weakness   Hydrocodone Bit-Homatrop Mbr Other (See Comments)    sleepy   Lurasidone     Other reaction(s): Other (See Comments) diskinesia   Paroxetine Hcl     Other reaction(s): Other (See Comments) Patient doesn't recall side effect   Tizanidine     Other reaction(s): Other (See Comments) Patient doesn't recall side effect   Amoxicillin Rash   Levofloxacin Diarrhea   Metformin And Related Diarrhea    Past Medical History:  Diagnosis Date   Anxiety    Asthma    Depression    GERD (gastroesophageal reflux disease)    Hypercholesterolemia    Hypertension    Obsessive compulsive disorder     Past Medical History, Surgical history, Social history, and Family history were reviewed and updated as appropriate.   Please see review of systems for further details on the patient's review from today.   Objective:   Physical Exam:  There were no vitals taken for this visit.  Physical Exam Constitutional:      General: She is not in acute distress. Musculoskeletal:        General: No deformity.  Neurological:     Mental Status: She is alert and oriented to person, place, and time.     Coordination: Coordination normal.  Psychiatric:        Attention and Perception: Attention and perception normal. She does not perceive auditory or visual hallucinations.        Mood and Affect: Affect is not labile, blunt, angry or inappropriate.        Speech: Speech normal.        Behavior: Behavior normal.        Thought Content: Thought content normal. Thought content is not paranoid or delusional. Thought  content does not include homicidal or suicidal ideation. Thought content does not include homicidal or suicidal plan.        Cognition and Memory: Cognition and memory normal.        Judgment: Judgment normal.     Comments: Insight intact     Lab Review:     Component Value Date/Time   NA 136 02/16/2019 0553   K 3.7 02/16/2019 0553   CL 104 02/16/2019 0553   CO2 22 02/16/2019 0553   GLUCOSE 128 (H) 02/16/2019 0553   BUN 12 02/16/2019 0553   CREATININE 0.69 02/16/2019 0553   CALCIUM 8.1 (L) 02/16/2019 0553   PROT 6.7 02/15/2019 1251  ALBUMIN 3.6 02/15/2019 1251   AST 42 (H) 02/15/2019 1251   ALT 41 02/15/2019 1251   ALKPHOS 63 02/15/2019 1251   BILITOT 0.9 02/15/2019 1251   GFRNONAA >60 02/16/2019 0553   GFRAA >60 02/16/2019 0553       Component Value Date/Time   WBC 14.0 (H) 02/16/2019 0553   RBC 3.90 02/16/2019 0553   HGB 11.9 (L) 02/16/2019 0553   HCT 37.1 02/16/2019 0553   PLT 227 02/16/2019 0553   MCV 95.1 02/16/2019 0553   MCH 30.5 02/16/2019 0553   MCHC 32.1 02/16/2019 0553   RDW 12.6 02/16/2019 0553   LYMPHSABS 1.6 03/12/2011 1154   MONOABS 0.6 03/12/2011 1154   EOSABS 0.1 03/12/2011 1154   BASOSABS 0.0 03/12/2011 1154    No results found for: "POCLITH", "LITHIUM"   No results found for: "PHENYTOIN", "PHENOBARB", "VALPROATE", "CBMZ"   .res Assessment: Plan:    Plan:  1. Prozac 20mg  TID 2. Decrease Xanax 1mg  BID to 1mg  in the morning and 1/2 tablet at bedtime 3. Decrease Buspar 15mg  TID   RTC 3 months  Patient advised to contact office with any questions, adverse effects, or acute worsening in signs and symptoms.  Discussed potential benefits, risk, and side effects of benzodiazepines to include potential risk of tolerance and dependence, as well as possible drowsiness. Advised patient not to drive if experiencing drowsiness and to take lowest possible effective dose to minimize risk of dependence and tolerance.  There are no diagnoses linked to  this encounter.   Please see After Visit Summary for patient specific instructions.  Future Appointments  Date Time Provider Department Center  03/26/2023 11:40 AM Alvester Eads, Thereasa Solo, NP [facility] None    No orders of the defined types were placed in this encounter.   -------------------------------

## 2023-05-31 ENCOUNTER — Other Ambulatory Visit: Payer: Self-pay | Admitting: Adult Health

## 2023-05-31 DIAGNOSIS — F411 Generalized anxiety disorder: Secondary | ICD-10-CM

## 2023-05-31 DIAGNOSIS — F410 Panic disorder [episodic paroxysmal anxiety] without agoraphobia: Secondary | ICD-10-CM

## 2023-06-15 ENCOUNTER — Encounter: Payer: Self-pay | Admitting: Adult Health

## 2023-06-15 ENCOUNTER — Ambulatory Visit: Payer: Medicare HMO | Admitting: Adult Health

## 2023-06-15 DIAGNOSIS — F410 Panic disorder [episodic paroxysmal anxiety] without agoraphobia: Secondary | ICD-10-CM

## 2023-06-15 DIAGNOSIS — F331 Major depressive disorder, recurrent, moderate: Secondary | ICD-10-CM

## 2023-06-15 DIAGNOSIS — F411 Generalized anxiety disorder: Secondary | ICD-10-CM | POA: Diagnosis not present

## 2023-06-15 DIAGNOSIS — F429 Obsessive-compulsive disorder, unspecified: Secondary | ICD-10-CM | POA: Diagnosis not present

## 2023-06-15 DIAGNOSIS — G2401 Drug induced subacute dyskinesia: Secondary | ICD-10-CM

## 2023-06-15 MED ORDER — ALPRAZOLAM 1 MG PO TABS
ORAL_TABLET | ORAL | 2 refills | Status: DC
Start: 2023-06-15 — End: 2023-09-15

## 2023-06-15 NOTE — Progress Notes (Signed)
Kayla Beard [id number] 01/26/1946 77 y.o.  Subjective:   Patient ID:  Kayla Beard is a 77 y.o. (DOB [birth year]) female.  Chief Complaint: No chief complaint on file.   HPI Kayla Beard presents to the office today for follow-up of anxiety, depression, OCD, and panic attacks.  Describes mood today as "ok". Pleasant. Denies tearfulness. Mood symptoms - reporting periods of depression and anxiety. Denies irritability. Reports situational stressors. Denies panic attacks. Reports some worry, rumination, and over thinking. Mood is consistent. Stating "I feel like I'm doing ok". Feels like medications are helpful. Following up with health care providers. She and husband doing well. Stable interest and motivation. Taking medications as prescribed. Energy levels lower. Active, does not have a regular exercise routine with current physical disabilities. She has been working with physical therapy and feels like it has been helpful. Enjoys some usual interests and activities. Married. Lives with husband. Has 2 grown sons - grandchildren. Spending time with family and friends. Involved with church. Attends church a few times a month. Has friends she talks to. Appetite adequate. Weight gain - 147 pounds.  Sleeps well most nights. Averages 12 hours a day. Reports daytime napping. Focus and concentration stable. Completing tasks. Managing aspects of household. Retired - disabled.  Denies SI or HI.  Denies AH or VH. Denies self harm. Denies substance use.  Review of Systems:  Review of Systems  Musculoskeletal:  Negative for gait problem.  Neurological:  Negative for tremors.  Psychiatric/Behavioral:         Please refer to HPI    Medications: I have reviewed the patient's current medications.  Current Outpatient Medications  Medication Sig Dispense Refill   acetaminophen (TYLENOL) 325 MG tablet Take 2 tablets (650 mg total) by mouth every 6 (six) hours as needed for mild pain or moderate pain. 30 tablet  3   alendronate (FOSAMAX) 70 MG tablet Take 70 mg by mouth every morning.     ALPRAZolam (XANAX) 1 MG tablet TAKE 1/2 TABLET THREE TIMES DAILY AND 1/2 TAB EXTRA FOR SEVERE ANXIETY 60 tablet 0   busPIRone (BUSPAR) 15 MG tablet Take 1 tablet (15 mg total) by mouth 2 (two) times daily. 180 tablet 3   diclofenac (VOLTAREN) 75 MG EC tablet Take 75 mg by mouth 2 (two) times daily.     ezetimibe (ZETIA) 10 MG tablet Take 10 mg by mouth daily.     FLUoxetine (PROZAC) 20 MG capsule Take 1 capsule (20 mg total) by mouth 3 (three) times daily. 270 capsule 3   gabapentin (NEURONTIN) 300 MG capsule Take by mouth.     ipratropium (ATROVENT) 0.03 % nasal spray SPRAY 2 SPRAYS INTO EACH NOSTRIL TWICE A DAY     lansoprazole (PREVACID SOLUTAB) 30 MG disintegrating tablet Take 1 tablet (30 mg total) by mouth daily. 21 tablet 1   metoprolol (TOPROL-XL) 50 MG 24 hr tablet Take 50 mg by mouth daily.       metoprolol tartrate (LOPRESSOR) 25 MG tablet TAKE 0.5 TABLETS (12.5 MG TOTAL) BY MOUTH 2 TIMES DAILY.     nitrofurantoin, macrocrystal-monohydrate, (MACROBID) 100 MG capsule Take 100 mg by mouth 2 (two) times daily.     pantoprazole (PROTONIX) 40 MG tablet TAKE 1 TABLET BY MOUTH EVERYDAY AT BEDTIME     predniSONE (DELTASONE) 5 MG tablet TAKE 1 TABLET 3 TIMES A DAY X 5 DAYS THEN TWICE DAILY X 5 DAYS THEN 1 TAB EVERY DAY     primidone (MYSOLINE) 50 MG tablet Take  50 mg by mouth 2 (two) times daily.     rosuvastatin (CRESTOR) 10 MG tablet Take 10 mg by mouth daily.     SYMBICORT 160-4.5 MCG/ACT inhaler USE 2 INHALATIONS TWICE A DAY     No current facility-administered medications for this visit.    Medication Side Effects: None  Allergies:  Allergies  Allergen Reactions   Hydroxyzine     Other reaction(s): Other (See Comments) Sleepiness Nightmares    Prednisone Anxiety    Other reaction(s): Other (See Comments)   Rivaroxaban Other (See Comments)    Bleeding Bleeding    Clindamycin Diarrhea    Dicyclomine     Other reaction(s): Other (See Comments) Patient doesn't recall side effect   Doxepin    Duloxetine     Other reaction(s): Other (See Comments) Patient doesn't recall side effect   Erythromycin Base Diarrhea   Escitalopram     Other reaction(s): Other (See Comments) Intensifies ocd   Flecainide     Other reaction(s): Other (See Comments) weakness   Hydrocodone Bit-Homatrop Mbr Other (See Comments)    sleepy   Lurasidone     Other reaction(s): Other (See Comments) diskinesia   Paroxetine Hcl     Other reaction(s): Other (See Comments) Patient doesn't recall side effect   Tizanidine     Other reaction(s): Other (See Comments) Patient doesn't recall side effect   Amoxicillin Rash   Levofloxacin Diarrhea   Metformin And Related Diarrhea    Past Medical History:  Diagnosis Date   Anxiety    Asthma    Depression    GERD (gastroesophageal reflux disease)    Hypercholesterolemia    Hypertension    Obsessive compulsive disorder     Past Medical History, Surgical history, Social history, and Family history were reviewed and updated as appropriate.   Please see review of systems for further details on the patient's review from today.   Objective:   Physical Exam:  There were no vitals taken for this visit.  Physical Exam Constitutional:      General: She is not in acute distress. Musculoskeletal:        General: No deformity.  Neurological:     Mental Status: She is alert and oriented to person, place, and time.     Coordination: Coordination normal.  Psychiatric:        Attention and Perception: Attention and perception normal. She does not perceive auditory or visual hallucinations.        Mood and Affect: Mood normal. Mood is not anxious or depressed. Affect is not labile, blunt, angry or inappropriate.        Speech: Speech normal.        Behavior: Behavior normal.        Thought Content: Thought content normal. Thought content is not paranoid or  delusional. Thought content does not include homicidal or suicidal ideation. Thought content does not include homicidal or suicidal plan.        Cognition and Memory: Cognition and memory normal.        Judgment: Judgment normal.     Comments: Insight intact     Lab Review:     Component Value Date/Time   NA 136 02/16/2019 0553   K 3.7 02/16/2019 0553   CL 104 02/16/2019 0553   CO2 22 02/16/2019 0553   GLUCOSE 128 (H) 02/16/2019 0553   BUN 12 02/16/2019 0553   CREATININE 0.69 02/16/2019 0553   CALCIUM 8.1 (L) 02/16/2019 0553   PROT  6.7 02/15/2019 1251   ALBUMIN 3.6 02/15/2019 1251   AST 42 (H) 02/15/2019 1251   ALT 41 02/15/2019 1251   ALKPHOS 63 02/15/2019 1251   BILITOT 0.9 02/15/2019 1251   GFRNONAA >60 02/16/2019 0553   GFRAA >60 02/16/2019 0553       Component Value Date/Time   WBC 14.0 (H) 02/16/2019 0553   RBC 3.90 02/16/2019 0553   HGB 11.9 (L) 02/16/2019 0553   HCT 37.1 02/16/2019 0553   PLT 227 02/16/2019 0553   MCV 95.1 02/16/2019 0553   MCH 30.5 02/16/2019 0553   MCHC 32.1 02/16/2019 0553   RDW 12.6 02/16/2019 0553   LYMPHSABS 1.6 03/12/2011 1154   MONOABS 0.6 03/12/2011 1154   EOSABS 0.1 03/12/2011 1154   BASOSABS 0.0 03/12/2011 1154    No results found for: "POCLITH", "LITHIUM"   No results found for: "PHENYTOIN", "PHENOBARB", "VALPROATE", "CBMZ"   .res Assessment: Plan:    Plan:  Prozac 20mg  TID Buspar 15mg  BID  Xanax 1mg  in the morning and 1/2 tablet at bedtime.  RTC 3 months  Patient advised to contact office with any questions, adverse effects, or acute worsening in signs and symptoms.  Discussed potential benefits, risk, and side effects of benzodiazepines to include potential risk of tolerance and dependence, as well as possible drowsiness. Advised patient not to drive if experiencing drowsiness and to take lowest possible effective dose to minimize risk of dependence and tolerance.  There are no diagnoses linked to this encounter.    Please see After Visit Summary for patient specific instructions.  Future Appointments  Date Time Provider Department Center  06/15/2023  9:00 AM Erich Kochan, Thereasa Solo, NP [facility] None    No orders of the defined types were placed in this encounter.   -------------------------------

## 2023-06-21 ENCOUNTER — Telehealth: Payer: Self-pay | Admitting: Adult Health

## 2023-06-21 NOTE — Telephone Encounter (Signed)
Had already called patient.

## 2023-06-21 NOTE — Telephone Encounter (Signed)
Gina notified of conversation.

## 2023-06-21 NOTE — Telephone Encounter (Signed)
Pt lvm that she would like gina to give her a call when she can. Her phone number is [phone]

## 2023-06-21 NOTE — Telephone Encounter (Signed)
Patient lvm at 10:59 regarding previous vm left. States she has figured out what the issue was and no longer needs rtc from [place]. She will callback if she has any other concerns or questions

## 2023-06-25 ENCOUNTER — Ambulatory Visit: Payer: Medicare HMO | Admitting: Adult Health

## 2023-06-28 ENCOUNTER — Other Ambulatory Visit: Payer: Self-pay | Admitting: Adult Health

## 2023-06-28 DIAGNOSIS — F411 Generalized anxiety disorder: Secondary | ICD-10-CM

## 2023-06-28 NOTE — Telephone Encounter (Signed)
I changed the sig bc lv 10/29 note said  Buspar 15 mg BID

## 2023-09-15 ENCOUNTER — Ambulatory Visit: Payer: Medicare HMO | Admitting: Adult Health

## 2023-09-15 ENCOUNTER — Encounter: Payer: Self-pay | Admitting: Adult Health

## 2023-09-15 DIAGNOSIS — F410 Panic disorder [episodic paroxysmal anxiety] without agoraphobia: Secondary | ICD-10-CM | POA: Diagnosis not present

## 2023-09-15 DIAGNOSIS — F331 Major depressive disorder, recurrent, moderate: Secondary | ICD-10-CM

## 2023-09-15 DIAGNOSIS — F419 Anxiety disorder, unspecified: Secondary | ICD-10-CM

## 2023-09-15 DIAGNOSIS — F32A Depression, unspecified: Secondary | ICD-10-CM

## 2023-09-15 DIAGNOSIS — F411 Generalized anxiety disorder: Secondary | ICD-10-CM

## 2023-09-15 DIAGNOSIS — F429 Obsessive-compulsive disorder, unspecified: Secondary | ICD-10-CM

## 2023-09-15 MED ORDER — ALPRAZOLAM 1 MG PO TABS
ORAL_TABLET | ORAL | 2 refills | Status: DC
Start: 2023-09-15 — End: 2023-10-22

## 2023-09-15 MED ORDER — FLUOXETINE HCL 20 MG PO CAPS: 20.0000 mg | ORAL_CAPSULE | Freq: Two times a day (BID) | ORAL | 3 refills | Status: DC

## 2023-09-15 NOTE — Progress Notes (Signed)
Kayla Beard [id number] Jan 25, 1946 78 y.o.  Virtual Visit via Telephone Note  I connected with pt on 09/15/23 at 10:00 AM EST by telephone and verified that I am speaking with the correct person using two identifiers.   I discussed the limitations, risks, security and privacy concerns of performing an evaluation and management service by telephone and the availability of in person appointments. I also discussed with the patient that there may be a patient responsible charge related to this service. The patient expressed understanding and agreed to proceed.   I discussed the assessment and treatment plan with the patient. The patient was provided an opportunity to ask questions and all were answered. The patient agreed with the plan and demonstrated an understanding of the instructions.   The patient was advised to call back or seek an in-person evaluation if the symptoms worsen or if the condition fails to improve as anticipated.  I provided 25 minutes of non-face-to-face time during this encounter.  The patient was located at home.  The provider was located at [facility].   Kayla Gibbs, NP   Subjective:   Patient ID:  Kayla Beard is a 78 y.o. (DOB May 20, 1946) female.  Chief Complaint: No chief complaint on file.   HPI Kayla Beard presents for follow-up of anxiety, depression, OCD, and panic attacks.  Describes mood today as "ok". Pleasant. Denies tearfulness. Mood symptoms - reporting periods of depression and anxiety. Denies irritability. Reports situational stressors surrounding her health. Denies panic attacks. Reports some worry, rumination, and over thinking. Mood is consistent. Stating "I feel like I'm doing alright". Feels like medications are helpful. Following up with health care providers. She and husband doing well. Stable interest and motivation. Taking medications as prescribed. Energy levels lower. Active, does not have a regular exercise routine with current  physical disabilities.  Enjoys some usual interests and activities. Married. Lives with husband. Has 2 grown sons - grandchildren. Spending time with family and friends. Attends church. Has friends she talks to. Appetite adequate. Weight loss - 137 from 147 pounds.  Sleeps well most nights. Averages 12 hours a day. Reports daytime napping. Focus and concentration stable. Completing tasks. Managing aspects of household. Retired - disabled.  Denies SI or HI.  Denies AH or VH. Denies self harm. Denies substance use.    Review of Systems:  Review of Systems  Musculoskeletal:  Negative for gait problem.  Neurological:  Negative for tremors.  Psychiatric/Behavioral:         Please refer to HPI    Medications: I have reviewed the patient's current medications.  Current Outpatient Medications  Medication Sig Dispense Refill   acetaminophen (TYLENOL) 325 MG tablet Take 2 tablets (650 mg total) by mouth every 6 (six) hours as needed for mild pain or moderate pain. 30 tablet 3   alendronate (FOSAMAX) 70 MG tablet Take 70 mg by mouth every morning.     ALPRAZolam (XANAX) 1 MG tablet TAKE 1/2 TABLET THREE TIMES DAILY AND 1/2 TAB EXTRA FOR SEVERE ANXIETY 60 tablet 2   busPIRone (BUSPAR) 15 MG tablet Take 1 tablet (15 mg total) by mouth 2 (two) times daily. 180 tablet 0   diclofenac (VOLTAREN) 75 MG EC tablet Take 75 mg by mouth 2 (two) times daily.     ezetimibe (ZETIA) 10 MG tablet Take 10 mg by mouth daily.     FLUoxetine (PROZAC) 20 MG capsule Take 1 capsule (20 mg total) by mouth 3 (three) times daily. 270 capsule 3  gabapentin (NEURONTIN) 300 MG capsule Take by mouth.     ipratropium (ATROVENT) 0.03 % nasal spray SPRAY 2 SPRAYS INTO EACH NOSTRIL TWICE A DAY     lansoprazole (PREVACID SOLUTAB) 30 MG disintegrating tablet Take 1 tablet (30 mg total) by mouth daily. 21 tablet 1   metoprolol (TOPROL-XL) 50 MG 24 hr tablet Take 50 mg by mouth daily.       metoprolol tartrate (LOPRESSOR) 25 MG  tablet TAKE 0.5 TABLETS (12.5 MG TOTAL) BY MOUTH 2 TIMES DAILY.     nitrofurantoin, macrocrystal-monohydrate, (MACROBID) 100 MG capsule Take 100 mg by mouth 2 (two) times daily.     pantoprazole (PROTONIX) 40 MG tablet TAKE 1 TABLET BY MOUTH EVERYDAY AT BEDTIME     predniSONE (DELTASONE) 5 MG tablet TAKE 1 TABLET 3 TIMES A DAY X 5 DAYS THEN TWICE DAILY X 5 DAYS THEN 1 TAB EVERY DAY     primidone (MYSOLINE) 50 MG tablet Take 50 mg by mouth 2 (two) times daily.     rosuvastatin (CRESTOR) 10 MG tablet Take 10 mg by mouth daily.     SYMBICORT 160-4.5 MCG/ACT inhaler USE 2 INHALATIONS TWICE A DAY     No current facility-administered medications for this visit.    Medication Side Effects: None  Allergies:  Allergies  Allergen Reactions   Hydroxyzine     Other reaction(s): Other (See Comments) Sleepiness Nightmares    Prednisone Anxiety    Other reaction(s): Other (See Comments)   Rivaroxaban Other (See Comments)    Bleeding Bleeding    Clindamycin Diarrhea   Dicyclomine     Other reaction(s): Other (See Comments) Patient doesn't recall side effect   Doxepin    Duloxetine     Other reaction(s): Other (See Comments) Patient doesn't recall side effect   Erythromycin Base Diarrhea   Escitalopram     Other reaction(s): Other (See Comments) Intensifies ocd   Flecainide     Other reaction(s): Other (See Comments) weakness   Hydrocodone Bit-Homatrop Mbr Other (See Comments)    sleepy   Lurasidone     Other reaction(s): Other (See Comments) diskinesia   Paroxetine Hcl     Other reaction(s): Other (See Comments) Patient doesn't recall side effect   Tizanidine     Other reaction(s): Other (See Comments) Patient doesn't recall side effect   Amoxicillin Rash   Levofloxacin Diarrhea   Metformin And Related Diarrhea    Past Medical History:  Diagnosis Date   Anxiety    Asthma    Depression    GERD (gastroesophageal reflux disease)    Hypercholesterolemia    Hypertension     Obsessive compulsive disorder     Family History  Family history unknown: Yes    Social History   Socioeconomic History   Marital status: Married    Spouse name: Not on file   Number of children: Not on file   Years of education: Not on file   Highest education level: Not on file  Occupational History   Not on file  Tobacco Use   Smoking status: Never   Smokeless tobacco: Never  Substance and Sexual Activity   Alcohol use: No   Drug use: No   Sexual activity: Not on file  Other Topics Concern   Not on file  Social History Narrative   Not on file   Social Drivers of Health   Financial Resource Strain: Not on file  Food Insecurity: Low Risk  (03/14/2023)   Received from [facility]   Hunger Vital Sign    Worried About Running Out of Food in the Last Year: Never true    Ran Out of Food in the Last Year: Never true  Transportation Needs: Not on file (03/14/2023)  Physical Activity: Not on file  Stress: Not on file  Social Connections: Not on file  Intimate Partner Violence: Not on file    Past Medical History, Surgical history, Social history, and Family history were reviewed and updated as appropriate.   Please see review of systems for further details on the patient's review from today.   Objective:   Physical Exam:  There were no vitals taken for this visit.  Physical Exam Constitutional:      General: She is not in acute distress. Musculoskeletal:        General: No deformity.  Neurological:     Mental Status: She is alert and oriented to person, place, and time.     Coordination: Coordination normal.  Psychiatric:        Attention and Perception: Attention and perception normal. She does not perceive auditory or visual hallucinations.        Mood and Affect: Mood normal. Mood is not anxious or depressed. Affect is not labile, blunt, angry or inappropriate.        Speech: Speech normal.        Behavior: Behavior normal.        Thought Content:  Thought content normal. Thought content is not paranoid or delusional. Thought content does not include homicidal or suicidal ideation. Thought content does not include homicidal or suicidal plan.        Cognition and Memory: Cognition and memory normal.        Judgment: Judgment normal.     Comments: Insight intact     Lab Review:     Component Value Date/Time   NA 136 02/16/2019 0553   K 3.7 02/16/2019 0553   CL 104 02/16/2019 0553   CO2 22 02/16/2019 0553   GLUCOSE 128 (H) 02/16/2019 0553   BUN 12 02/16/2019 0553   CREATININE 0.69 02/16/2019 0553   CALCIUM 8.1 (L) 02/16/2019 0553   PROT 6.7 02/15/2019 1251   ALBUMIN 3.6 02/15/2019 1251   AST 42 (H) 02/15/2019 1251   ALT 41 02/15/2019 1251   ALKPHOS 63 02/15/2019 1251   BILITOT 0.9 02/15/2019 1251   GFRNONAA >60 02/16/2019 0553   GFRAA >60 02/16/2019 0553       Component Value Date/Time   WBC 14.0 (H) 02/16/2019 0553   RBC 3.90 02/16/2019 0553   HGB 11.9 (L) 02/16/2019 0553   HCT 37.1 02/16/2019 0553   PLT 227 02/16/2019 0553   MCV 95.1 02/16/2019 0553   MCH 30.5 02/16/2019 0553   MCHC 32.1 02/16/2019 0553   RDW 12.6 02/16/2019 0553   LYMPHSABS 1.6 03/12/2011 1154   MONOABS 0.6 03/12/2011 1154   EOSABS 0.1 03/12/2011 1154   BASOSABS 0.0 03/12/2011 1154    No results found for: "POCLITH", "LITHIUM"   No results found for: "PHENYTOIN", "PHENOBARB", "VALPROATE", "CBMZ"   .res Assessment: Plan:    Plan:  Prozac 20mg  TID to BID Buspar 15mg  BID Xanax 1mg  in the morning and 1/2 tablet at bedtime.  RTC 3 months  30 minutes spent dedicated to the care of this patient on the date of this encounter to include pre-visit review of records, ordering of medication, post visit documentation, and face-to-face time with the patient discussing anxiety, depression, OCD, and panic  attacks.  Discussed continuing current medication regimen.   Patient advised to contact office with any questions, adverse effects, or acute  worsening in signs and symptoms.  Discussed potential benefits, risk, and side effects of benzodiazepines to include potential risk of tolerance and dependence, as well as possible drowsiness. Advised patient not to drive if experiencing drowsiness and to take lowest possible effective dose to minimize risk of dependence and tolerance.  There are no diagnoses linked to this encounter.  Please see After Visit Summary for patient specific instructions.  Future Appointments  Date Time Provider Department Center  09/15/2023 10:00 AM Stepfon Rawles, Thereasa Solo, NP [facility] None    No orders of the defined types were placed in this encounter.     -------------------------------

## 2023-10-04 ENCOUNTER — Telehealth: Payer: Self-pay | Admitting: Adult Health

## 2023-10-04 NOTE — Telephone Encounter (Signed)
 Kayla Beard called  with confusion about all her pills. She takes Buspirone HCL 15 mg, Buspirone 3/day at noon and Buspirone 15 mg, two by mouth twice a day. Kayla Beard states she is very confused about how to take these medications. Could someone please call her at [phone]. Pharmacy is:  [id number] - [place], [place] - [address]   Phone: [phone]  Fax: [phone]

## 2023-10-04 NOTE — Telephone Encounter (Signed)
 Called patient and she had several bottles of Buspar 15 mg, some as BID and one as TID. I told her that all should be BID. The Prozac should be BID as well. Reviewed with her multiple times and then reviewed with her husband.

## 2023-10-15 ENCOUNTER — Telehealth: Payer: Self-pay | Admitting: Adult Health

## 2023-10-15 NOTE — Telephone Encounter (Signed)
 No phone calls are noted this week that pt called unless Almira Coaster spoke with her due to being on call.   Are you aware of her hospitalization? Would you like me to call?

## 2023-10-15 NOTE — Telephone Encounter (Signed)
 Rtc to pt and she reports going into the hospital for 2 days due to confusion and hallucinations. She reports she thought she was in the wrong house. She reports feeling much better, thoughts were clear. She reports seeing her neurologist Dr. Antonietta Barcelona this week and he is wanting to discuss his observations or make Gina aware on his end. See his note in epic. She reports he is wanting to do some testing the beginning of March. She reports her current symptoms are mouth quivering which she has discussed with Almira Coaster previously and tried the medication she gave her but was unable to continue taking, didn't feel like it helped. She reports her husband gives her all her medication and she has reduced the dose of Xanax to 1/2 tablet in the AM and 1/2 tablet at bedtime. Pt does not have apt with Almira Coaster until April but with recent hospitalization informed her she may need a sooner apt since neurologist is recommending testing. Informed her we would get back with her.    Sooner apt?

## 2023-10-15 NOTE — Telephone Encounter (Signed)
 Pt called at 9:09a.  She said she has called several times this week,  but has not heard back.  I don't see any messages from this week.  She said she had to be hospitalized last weekend.  She said she decreased her Prozac.  She said she was haviing hallucinations (I believe that was why she went to the hospital, although it was kinda hard to follow her story).  The main thing is that she said she is having symptoms from her meds.  She's not sure if it's the Prozac but she wants to talk to Almira Coaster to make sure how to take her meds.  Next appt 4/21

## 2023-10-18 NOTE — Telephone Encounter (Signed)
 Can she get an earlier apt with Almira Coaster please

## 2023-10-19 NOTE — Telephone Encounter (Signed)
 Apt 3/07

## 2023-10-19 NOTE — Telephone Encounter (Signed)
Scheduled appt for 3/7

## 2023-10-22 ENCOUNTER — Ambulatory Visit: Admitting: Adult Health

## 2023-10-22 ENCOUNTER — Encounter: Payer: Self-pay | Admitting: Adult Health

## 2023-10-22 DIAGNOSIS — F429 Obsessive-compulsive disorder, unspecified: Secondary | ICD-10-CM

## 2023-10-22 DIAGNOSIS — F410 Panic disorder [episodic paroxysmal anxiety] without agoraphobia: Secondary | ICD-10-CM | POA: Diagnosis not present

## 2023-10-22 DIAGNOSIS — F411 Generalized anxiety disorder: Secondary | ICD-10-CM

## 2023-10-22 DIAGNOSIS — F419 Anxiety disorder, unspecified: Secondary | ICD-10-CM | POA: Diagnosis not present

## 2023-10-22 DIAGNOSIS — F331 Major depressive disorder, recurrent, moderate: Secondary | ICD-10-CM

## 2023-10-22 DIAGNOSIS — F32A Depression, unspecified: Secondary | ICD-10-CM | POA: Diagnosis not present

## 2023-10-22 NOTE — Progress Notes (Signed)
 Kayla Beard [id number] 21-Jan-1946 78 y.o.  Virtual Visit via Telephone Note  I connected with pt on 10/22/23 at 10:30 AM EST by telephone and verified that I am speaking with the correct person using two identifiers.   I discussed the limitations, risks, security and privacy concerns of performing an evaluation and management service by telephone and the availability of in person appointments. I also discussed with the patient that there may be a patient responsible charge related to this service. The patient expressed understanding and agreed to proceed.   I discussed the assessment and treatment plan with the patient. The patient was provided an opportunity to ask questions and all were answered. The patient agreed with the plan and demonstrated an understanding of the instructions.   The patient was advised to call back or seek an in-person evaluation if the symptoms worsen or if the condition fails to improve as anticipated.  I provided 25 minutes of non-face-to-face time during this encounter.  The patient was located at home.  The provider was located at [facility].   Dorothyann Gibbs, NP   Subjective:   Patient ID:  Kayla Beard is a 78 y.o. (DOB 1946-07-05) female.  Chief Complaint: No chief complaint on file.   HPI Kayla Beard presents for follow-up of anxiety, depression, OCD, and panic attacks.  Describes mood today as "ok". Pleasant. Denies tearfulness. Mood symptoms - reports depression, anxiety and irritability. Reports varying interest and motivation. Denies recent panic attacks. Reports some worry, rumination, and over thinking. Mood is variable. Stating "I feel like I'm struggling". Feels like medications are helpful. Following up with health care providers.  Taking medications as prescribed. Energy levels lower. Active, does not have a regular exercise routine with current physical disabilities.  Reports she some usual interests and activities. Married. Lives  with husband. Has 2 grown sons - grandchildren. Spending time with family and friends. Attends church. Has friends she talks to. Appetite adequate. Weight loss - 134 from 137 pounds.  Reports sleeping better some nights than others. Reports daytime napping. Reports focus and concentration difficulties. Completing tasks. Managing minimal aspects of household. Retired - disabled.  Denies SI or HI.  Denies AH or VH. Denies self harm. Denies substance use.   Review of Systems:  Review of Systems  Musculoskeletal:  Negative for gait problem.  Neurological:  Negative for tremors.  Psychiatric/Behavioral:         Please refer to HPI   Medications: I have reviewed the patient's current medications.  Current Outpatient Medications  Medication Sig Dispense Refill   acetaminophen (TYLENOL) 325 MG tablet Take 2 tablets (650 mg total) by mouth every 6 (six) hours as needed for mild pain or moderate pain. 30 tablet 3   alendronate (FOSAMAX) 70 MG tablet Take 70 mg by mouth every morning.     ALPRAZolam (XANAX) 1 MG tablet TAKE ONE TABLET IN THE MORNING AND 1/2 TABLET AT BEDTIME. 45 tablet 2   busPIRone (BUSPAR) 15 MG tablet Take 1 tablet (15 mg total) by mouth 2 (two) times daily. 180 tablet 0   diclofenac (VOLTAREN) 75 MG EC tablet Take 75 mg by mouth 2 (two) times daily.     ezetimibe (ZETIA) 10 MG tablet Take 10 mg by mouth daily.     FLUoxetine (PROZAC) 20 MG capsule Take 1 capsule (20 mg total) by mouth 2 (two) times daily. 180 capsule 3   gabapentin (NEURONTIN) 300 MG capsule Take by mouth.     ipratropium (ATROVENT)  0.03 % nasal spray SPRAY 2 SPRAYS INTO EACH NOSTRIL TWICE A DAY     lansoprazole (PREVACID SOLUTAB) 30 MG disintegrating tablet Take 1 tablet (30 mg total) by mouth daily. 21 tablet 1   metoprolol (TOPROL-XL) 50 MG 24 hr tablet Take 50 mg by mouth daily.       metoprolol tartrate (LOPRESSOR) 25 MG tablet TAKE 0.5 TABLETS (12.5 MG TOTAL) BY MOUTH 2 TIMES DAILY.     pantoprazole  (PROTONIX) 40 MG tablet TAKE 1 TABLET BY MOUTH EVERYDAY AT BEDTIME     predniSONE (DELTASONE) 5 MG tablet TAKE 1 TABLET 3 TIMES A DAY X 5 DAYS THEN TWICE DAILY X 5 DAYS THEN 1 TAB EVERY DAY     primidone (MYSOLINE) 50 MG tablet Take 50 mg by mouth 2 (two) times daily.     rosuvastatin (CRESTOR) 10 MG tablet Take 10 mg by mouth daily.     SYMBICORT 160-4.5 MCG/ACT inhaler USE 2 INHALATIONS TWICE A DAY     No current facility-administered medications for this visit.    Medication Side Effects: None  Allergies:  Allergies  Allergen Reactions   Hydroxyzine     Other reaction(s): Other (See Comments) Sleepiness Nightmares    Prednisone Anxiety    Other reaction(s): Other (See Comments)   Rivaroxaban Other (See Comments)    Bleeding Bleeding    Clindamycin Diarrhea   Dicyclomine     Other reaction(s): Other (See Comments) Patient doesn't recall side effect   Doxepin    Duloxetine     Other reaction(s): Other (See Comments) Patient doesn't recall side effect   Erythromycin Base Diarrhea   Escitalopram     Other reaction(s): Other (See Comments) Intensifies ocd   Flecainide     Other reaction(s): Other (See Comments) weakness   Hydrocodone Bit-Homatrop Mbr Other (See Comments)    sleepy   Lurasidone     Other reaction(s): Other (See Comments) diskinesia   Paroxetine Hcl     Other reaction(s): Other (See Comments) Patient doesn't recall side effect   Tizanidine     Other reaction(s): Other (See Comments) Patient doesn't recall side effect   Amoxicillin Rash   Levofloxacin Diarrhea   Metformin And Related Diarrhea    Past Medical History:  Diagnosis Date   Anxiety    Asthma    Depression    GERD (gastroesophageal reflux disease)    Hypercholesterolemia    Hypertension    Obsessive compulsive disorder     Family History  Family history unknown: Yes    Social History   Socioeconomic History   Marital status: Married    Spouse name: Not on file   Number  of children: Not on file   Years of education: Not on file   Highest education level: Not on file  Occupational History   Not on file  Tobacco Use   Smoking status: Never   Smokeless tobacco: Never  Substance and Sexual Activity   Alcohol use: No   Drug use: No   Sexual activity: Not on file  Other Topics Concern   Not on file  Social History Narrative   Not on file   Social Drivers of Health   Financial Resource Strain: Not on file  Food Insecurity: Low Risk  (09/17/2023)   Received from [facility]   Hunger Vital Sign    Worried About Running Out of Food in the Last Year: Never true    Ran Out of Food in the Last Year: Never  true  Transportation Needs: No Transportation Needs (09/17/2023)   Received from [facility]    In the past 12 months, has lack of reliable transportation kept you from medical appointments, meetings, work or from getting things needed for daily living? : No  Physical Activity: Not on file  Stress: Not on file  Social Connections: Not on file  Intimate Partner Violence: Not on file    Past Medical History, Surgical history, Social history, and Family history were reviewed and updated as appropriate.   Please see review of systems for further details on the patient's review from today.   Objective:   Physical Exam:  There were no vitals taken for this visit.  Physical Exam Constitutional:      General: She is not in acute distress. Musculoskeletal:        General: No deformity.  Neurological:     Mental Status: She is alert and oriented to person, place, and time.     Coordination: Coordination normal.  Psychiatric:        Attention and Perception: Attention and perception normal. She does not perceive auditory or visual hallucinations.        Mood and Affect: Affect is not labile, blunt, angry or inappropriate.        Speech: Speech normal.        Behavior: Behavior normal.        Thought Content: Thought content  normal. Thought content is not paranoid or delusional. Thought content does not include homicidal or suicidal ideation. Thought content does not include homicidal or suicidal plan.        Cognition and Memory: Cognition and memory normal.        Judgment: Judgment normal.     Comments: Insight intact     Lab Review:     Component Value Date/Time   NA 136 02/16/2019 0553   K 3.7 02/16/2019 0553   CL 104 02/16/2019 0553   CO2 22 02/16/2019 0553   GLUCOSE 128 (H) 02/16/2019 0553   BUN 12 02/16/2019 0553   CREATININE 0.69 02/16/2019 0553   CALCIUM 8.1 (L) 02/16/2019 0553   PROT 6.7 02/15/2019 1251   ALBUMIN 3.6 02/15/2019 1251   AST 42 (H) 02/15/2019 1251   ALT 41 02/15/2019 1251   ALKPHOS 63 02/15/2019 1251   BILITOT 0.9 02/15/2019 1251   GFRNONAA >60 02/16/2019 0553   GFRAA >60 02/16/2019 0553       Component Value Date/Time   WBC 14.0 (H) 02/16/2019 0553   RBC 3.90 02/16/2019 0553   HGB 11.9 (L) 02/16/2019 0553   HCT 37.1 02/16/2019 0553   PLT 227 02/16/2019 0553   MCV 95.1 02/16/2019 0553   MCH 30.5 02/16/2019 0553   MCHC 32.1 02/16/2019 0553   RDW 12.6 02/16/2019 0553   LYMPHSABS 1.6 03/12/2011 1154   MONOABS 0.6 03/12/2011 1154   EOSABS 0.1 03/12/2011 1154   BASOSABS 0.0 03/12/2011 1154    No results found for: "POCLITH", "LITHIUM"   No results found for: "PHENYTOIN", "PHENOBARB", "VALPROATE", "CBMZ"   .res Assessment: Plan:    Assessment: Plan:    Plan:  Prozac 20mg  BID Buspar 15mg  BID Xanax 0.5mg  BID - decreased dose  Consider low dose Cogentin - teeth clicking  Spoke with patient husband - discussed medication and plan moving forward - denies recent psychosis.  RTC 4 weeks  25 minutes spent dedicated to the care of this patient on the date of this encounter to include pre-visit  review of records, ordering of medication, post visit documentation, and face-to-face time with the patient discussing anxiety, depression, OCD, and panic  attacks. Discussed continuing current medication regimen.   Patient advised to contact office with any questions, adverse effects, or acute worsening in signs and symptoms.  Discussed potential benefits, risk, and side effects of benzodiazepines to include potential risk of tolerance and dependence, as well as possible drowsiness. Advised patient not to drive if experiencing drowsiness and to take lowest possible effective dose to minimize risk of dependence and tolerance.  There are no diagnoses linked to this encounter.  Please see After Visit Summary for patient specific instructions.  Future Appointments  Date Time Provider Department Center  10/22/2023 10:30 AM Niquan Charnley, Thereasa Solo, NP [facility] None  12/06/2023 12:00 PM Euna Armon, Thereasa Solo, NP [facility] None    No orders of the defined types were placed in this encounter.     -------------------------------

## 2023-11-19 ENCOUNTER — Ambulatory Visit: Admitting: Adult Health

## 2023-11-19 ENCOUNTER — Encounter: Payer: Self-pay | Admitting: Adult Health

## 2023-11-19 DIAGNOSIS — F331 Major depressive disorder, recurrent, moderate: Secondary | ICD-10-CM

## 2023-11-19 DIAGNOSIS — F410 Panic disorder [episodic paroxysmal anxiety] without agoraphobia: Secondary | ICD-10-CM | POA: Diagnosis not present

## 2023-11-19 DIAGNOSIS — F32A Depression, unspecified: Secondary | ICD-10-CM | POA: Diagnosis not present

## 2023-11-19 DIAGNOSIS — G2401 Drug induced subacute dyskinesia: Secondary | ICD-10-CM

## 2023-11-19 DIAGNOSIS — F419 Anxiety disorder, unspecified: Secondary | ICD-10-CM

## 2023-11-19 DIAGNOSIS — F411 Generalized anxiety disorder: Secondary | ICD-10-CM

## 2023-11-19 DIAGNOSIS — F429 Obsessive-compulsive disorder, unspecified: Secondary | ICD-10-CM

## 2023-11-19 NOTE — Progress Notes (Signed)
 Kayla Beard [id number] Nov 19, 1945 78 y.o.  Virtual Visit via Telephone Note  I connected with pt on 11/19/23 at 10:30 AM EDT by telephone and verified that I am speaking with the correct person using two identifiers.   I discussed the limitations, risks, security and privacy concerns of performing an evaluation and management service by telephone and the availability of in person appointments. I also discussed with the patient that there may be a patient responsible charge related to this service. The patient expressed understanding and agreed to proceed.   I discussed the assessment and treatment plan with the patient. The patient was provided an opportunity to ask questions and all were answered. The patient agreed with the plan and demonstrated an understanding of the instructions.   The patient was advised to call back or seek an in-person evaluation if the symptoms worsen or if the condition fails to improve as anticipated.  I provided 25 minutes of non-face-to-face time during this encounter.  The patient was located at home.  The provider was located at [facility].   Dorothyann Gibbs, NP   Subjective:   Patient ID:  Kayla Beard is a 78 y.o. (DOB [birth year]) female.  Chief Complaint: No chief complaint on file.   HPI Kayla Beard presents for follow-up of anxiety, depression, OCD, TD and panic attacks.  Describes mood today as "ok". Pleasant. Reports  tearfulness at times. Mood symptoms - reports depression - "feeling lonely". Not getting out as much. Talking with a friend every day. Reports anxiety and nervousness - "a few spells, but it comes and goes". Reports occasional irritability. Reports varying interest and motivation. Denies recent panic attacks - "not like I had previously". Reports some worry, rumination, and over thinking. Reports obsessive thoughts and acts - "checking" behaviors. Mood is variable. Stating "I feel like I'm doing better". Feels like medications  are helpful. Following up with health care providers.  Taking medications as prescribed. Energy levels improved. Active, does not have a regular exercise routine with current physical disabilities.  Reports she some usual interests and activities. Married. Lives with husband. Has 2 grown sons - grandchildren. Spending time with family and friends. Attends church. Has friends she talks to. Appetite adequate. Weight loss - 134 from 137 pounds.  Reports sleeping better some nights than others. Reports daytime napping. Reports focus and concentration has improved since last visit. Completing tasks. Managing minimal aspects of household. Retired - disabled.  Denies SI or HI.  Denies AH or VH. Denies self harm. Denies substance use.  Review of Systems:  Review of Systems  Musculoskeletal:  Negative for gait problem.  Neurological:  Negative for tremors.  Psychiatric/Behavioral:         Please refer to HPI    Medications: I have reviewed the patient's current medications.  Current Outpatient Medications  Medication Sig Dispense Refill   acetaminophen (TYLENOL) 325 MG tablet Take 2 tablets (650 mg total) by mouth every 6 (six) hours as needed for mild pain or moderate pain. 30 tablet 3   alendronate (FOSAMAX) 70 MG tablet Take 70 mg by mouth every morning.     busPIRone (BUSPAR) 15 MG tablet Take 1 tablet (15 mg total) by mouth 2 (two) times daily. 180 tablet 0   diclofenac (VOLTAREN) 75 MG EC tablet Take 75 mg by mouth 2 (two) times daily.     ezetimibe (ZETIA) 10 MG tablet Take 10 mg by mouth daily.     FLUoxetine (PROZAC) 20 MG capsule Take 1  capsule (20 mg total) by mouth 2 (two) times daily. 180 capsule 3   gabapentin (NEURONTIN) 300 MG capsule Take by mouth.     ipratropium (ATROVENT) 0.03 % nasal spray SPRAY 2 SPRAYS INTO EACH NOSTRIL TWICE A DAY     lansoprazole (PREVACID SOLUTAB) 30 MG disintegrating tablet Take 1 tablet (30 mg total) by mouth daily. 21 tablet 1   metoprolol  (TOPROL-XL) 50 MG 24 hr tablet Take 50 mg by mouth daily.       metoprolol tartrate (LOPRESSOR) 25 MG tablet TAKE 0.5 TABLETS (12.5 MG TOTAL) BY MOUTH 2 TIMES DAILY.     pantoprazole (PROTONIX) 40 MG tablet TAKE 1 TABLET BY MOUTH EVERYDAY AT BEDTIME     predniSONE (DELTASONE) 5 MG tablet TAKE 1 TABLET 3 TIMES A DAY X 5 DAYS THEN TWICE DAILY X 5 DAYS THEN 1 TAB EVERY DAY     primidone (MYSOLINE) 50 MG tablet Take 50 mg by mouth 2 (two) times daily.     rosuvastatin (CRESTOR) 10 MG tablet Take 10 mg by mouth daily.     SYMBICORT 160-4.5 MCG/ACT inhaler USE 2 INHALATIONS TWICE A DAY     No current facility-administered medications for this visit.    Medication Side Effects: None  Allergies:  Allergies  Allergen Reactions   Hydroxyzine     Other reaction(s): Other (See Comments) Sleepiness Nightmares    Prednisone Anxiety    Other reaction(s): Other (See Comments)   Rivaroxaban Other (See Comments)    Bleeding Bleeding    Clindamycin Diarrhea   Dicyclomine     Other reaction(s): Other (See Comments) Patient doesn't recall side effect   Doxepin    Duloxetine     Other reaction(s): Other (See Comments) Patient doesn't recall side effect   Erythromycin Base Diarrhea   Escitalopram     Other reaction(s): Other (See Comments) Intensifies ocd   Flecainide     Other reaction(s): Other (See Comments) weakness   Hydrocodone Bit-Homatrop Mbr Other (See Comments)    sleepy   Lurasidone     Other reaction(s): Other (See Comments) diskinesia   Paroxetine Hcl     Other reaction(s): Other (See Comments) Patient doesn't recall side effect   Tizanidine     Other reaction(s): Other (See Comments) Patient doesn't recall side effect   Amoxicillin Rash   Levofloxacin Diarrhea   Metformin And Related Diarrhea    Past Medical History:  Diagnosis Date   Anxiety    Asthma    Depression    GERD (gastroesophageal reflux disease)    Hypercholesterolemia    Hypertension    Obsessive  compulsive disorder     Family History  Family history unknown: Yes    Social History   Socioeconomic History   Marital status: Married    Spouse name: Not on file   Number of children: Not on file   Years of education: Not on file   Highest education level: Not on file  Occupational History   Not on file  Tobacco Use   Smoking status: Never   Smokeless tobacco: Never  Substance and Sexual Activity   Alcohol use: No   Drug use: No   Sexual activity: Not on file  Other Topics Concern   Not on file  Social History Narrative   Not on file   Social Drivers of Health   Financial Resource Strain: Not on file  Food Insecurity: Low Risk  (09/17/2023)   Received from [facility]   Hunger  Vital Sign    Worried About Programme researcher, broadcasting/film/video in the Last Year: Never true    Ran Out of Food in the Last Year: Never true  Transportation Needs: No Transportation Needs (09/17/2023)   Received from [facility]    In the past 12 months, has lack of reliable transportation kept you from medical appointments, meetings, work or from getting things needed for daily living? : No  Physical Activity: Not on file  Stress: Not on file  Social Connections: Not on file  Intimate Partner Violence: Not on file    Past Medical History, Surgical history, Social history, and Family history were reviewed and updated as appropriate.   Please see review of systems for further details on the patient's review from today.   Objective:   Physical Exam:  There were no vitals taken for this visit.  Physical Exam Constitutional:      General: She is not in acute distress. Musculoskeletal:        General: No deformity.  Neurological:     Mental Status: She is alert and oriented to person, place, and time.     Coordination: Coordination normal.  Psychiatric:        Attention and Perception: Attention and perception normal. She does not perceive auditory or visual hallucinations.         Mood and Affect: Affect is not labile, blunt, angry or inappropriate.        Speech: Speech normal.        Behavior: Behavior normal.        Thought Content: Thought content normal. Thought content is not paranoid or delusional. Thought content does not include homicidal or suicidal ideation. Thought content does not include homicidal or suicidal plan.        Cognition and Memory: Cognition and memory normal.        Judgment: Judgment normal.     Comments: Insight intact     Lab Review:     Component Value Date/Time   NA 136 02/16/2019 0553   K 3.7 02/16/2019 0553   CL 104 02/16/2019 0553   CO2 22 02/16/2019 0553   GLUCOSE 128 (H) 02/16/2019 0553   BUN 12 02/16/2019 0553   CREATININE 0.69 02/16/2019 0553   CALCIUM 8.1 (L) 02/16/2019 0553   PROT 6.7 02/15/2019 1251   ALBUMIN 3.6 02/15/2019 1251   AST 42 (H) 02/15/2019 1251   ALT 41 02/15/2019 1251   ALKPHOS 63 02/15/2019 1251   BILITOT 0.9 02/15/2019 1251   GFRNONAA >60 02/16/2019 0553   GFRAA >60 02/16/2019 0553       Component Value Date/Time   WBC 14.0 (H) 02/16/2019 0553   RBC 3.90 02/16/2019 0553   HGB 11.9 (L) 02/16/2019 0553   HCT 37.1 02/16/2019 0553   PLT 227 02/16/2019 0553   MCV 95.1 02/16/2019 0553   MCH 30.5 02/16/2019 0553   MCHC 32.1 02/16/2019 0553   RDW 12.6 02/16/2019 0553   LYMPHSABS 1.6 03/12/2011 1154   MONOABS 0.6 03/12/2011 1154   EOSABS 0.1 03/12/2011 1154   BASOSABS 0.0 03/12/2011 1154    No results found for: "POCLITH", "LITHIUM"   No results found for: "PHENYTOIN", "PHENOBARB", "VALPROATE", "CBMZ"   .res Assessment: Plan:    Plan:  Prozac 20mg  BID Buspar 15mg  BID Xanax 0.5mg  BID - plan to reduce next visit - spoke with husband and he feels she is doing better with reduced dose - will plan to continue taper at  next visit.  RTC 4 weeks  25 minutes spent dedicated to the care of this patient on the date of this encounter to include pre-visit review of records, ordering of  medication, post visit documentation, and face-to-face time with the patient discussing anxiety, depression, OCD, and panic attacks. Discussed continuing current medication regimen.   Patient advised to contact office with any questions, adverse effects, or acute worsening in signs and symptoms.  Discussed potential benefits, risk, and side effects of benzodiazepines to include potential risk of tolerance and dependence, as well as possible drowsiness. Advised patient not to drive if experiencing drowsiness and to take lowest possible effective dose to minimize risk of dependence and tolerance.  There are no diagnoses linked to this encounter.  Please see After Visit Summary for patient specific instructions.  Future Appointments  Date Time Provider Department Center  11/19/2023 10:30 AM Norvell Caswell, Thereasa Solo, NP [facility] None  12/06/2023 12:00 PM Willmer Fellers, Thereasa Solo, NP [facility] None    No orders of the defined types were placed in this encounter.     -------------------------------

## 2023-11-24 ENCOUNTER — Other Ambulatory Visit: Payer: Self-pay | Admitting: Adult Health

## 2023-11-24 DIAGNOSIS — F411 Generalized anxiety disorder: Secondary | ICD-10-CM

## 2023-11-26 ENCOUNTER — Other Ambulatory Visit: Payer: Self-pay | Admitting: Adult Health

## 2023-11-26 DIAGNOSIS — F411 Generalized anxiety disorder: Secondary | ICD-10-CM

## 2023-12-06 ENCOUNTER — Encounter: Payer: Self-pay | Admitting: Adult Health

## 2023-12-06 ENCOUNTER — Ambulatory Visit: Payer: Medicare HMO | Admitting: Adult Health

## 2023-12-06 DIAGNOSIS — F32A Depression, unspecified: Secondary | ICD-10-CM

## 2023-12-06 DIAGNOSIS — F331 Major depressive disorder, recurrent, moderate: Secondary | ICD-10-CM

## 2023-12-06 DIAGNOSIS — F419 Anxiety disorder, unspecified: Secondary | ICD-10-CM | POA: Diagnosis not present

## 2023-12-06 DIAGNOSIS — F410 Panic disorder [episodic paroxysmal anxiety] without agoraphobia: Secondary | ICD-10-CM | POA: Diagnosis not present

## 2023-12-06 DIAGNOSIS — G2401 Drug induced subacute dyskinesia: Secondary | ICD-10-CM

## 2023-12-06 DIAGNOSIS — F429 Obsessive-compulsive disorder, unspecified: Secondary | ICD-10-CM | POA: Diagnosis not present

## 2023-12-06 DIAGNOSIS — F411 Generalized anxiety disorder: Secondary | ICD-10-CM

## 2023-12-06 MED ORDER — ALPRAZOLAM 0.5 MG PO TABS: 0.5000 mg | ORAL_TABLET | Freq: Two times a day (BID) | ORAL | 2 refills | Status: DC | PRN

## 2023-12-06 NOTE — Progress Notes (Signed)
 Kayla Beard [id number] 11/16/1945 78 y.o.  Virtual Visit via Telephone Note  I connected with pt on 12/06/23 at 12:00 PM EDT by telephone and verified that I am speaking with the correct person using two identifiers.   I discussed the limitations, risks, security and privacy concerns of performing an evaluation and management service by telephone and the availability of in person appointments. I also discussed with the patient that there may be a patient responsible charge related to this service. The patient expressed understanding and agreed to proceed.   I discussed the assessment and treatment plan with the patient. The patient was provided an opportunity to ask questions and all were answered. The patient agreed with the plan and demonstrated an understanding of the instructions.   The patient was advised to call back or seek an in-person evaluation if the symptoms worsen or if the condition fails to improve as anticipated.  I provided 25 minutes of non-face-to-face time during this encounter.  The patient was located at home.  The provider was located at [facility].   Kayla Camera, NP   Subjective:   Patient ID:  Kayla Beard is a 78 y.o. (DOB July 22, 1946) female.  Chief Complaint: No chief complaint on file.   HPI Kayla Beard presents for follow-up of anxiety, depression, OCD, TD and panic attacks.  Describes mood today as "ok". Pleasant. Reports  tearfulness at times. Mood symptoms - denies depression. Reports improved interest and motivation.Talking with church members and a friend every day. Reports feeling anxious at times - "worried about her family". Reports less irritability.  Denies recent panic attacks. Reports worry, rumination and over thinking. Reports obsessive thoughts and acts - checking doors - locks - stove. Mood is stable. Stating "I feel like I'm doing ok". Feels like medications are helpful. Taking medications as prescribed. Energy levels improved.  Active, does not have a regular exercise routine with current physical disabilities.  Reports she enjoys some usual interests and activities. Married. Lives with husband. Has 2 grown sons - grandchildren. Spending time with family and friends. Talking to a friend daily. Attends church.  Appetite adequate. Weight stable - 136 pounds.  Reports sleeping better some nights than others - going to bed at 7pm. Stating "I'm sleeping good". Reports daytime napping - "once in a while". Reports focus and concentration difficulties - "not like I was". Completing tasks. Managing minimal aspects of household. Retired - disabled.  Denies SI or HI.  Denies AH or VH. Denies self harm. Denies substance use.  Review of Systems:  Review of Systems  Musculoskeletal:  Negative for gait problem.  Neurological:  Negative for tremors.  Psychiatric/Behavioral:         Please refer to HPI    Medications: I have reviewed the patient's current medications.  Current Outpatient Medications  Medication Sig Dispense Refill   acetaminophen  (TYLENOL ) 325 MG tablet Take 2 tablets (650 mg total) by mouth every 6 (six) hours as needed for mild pain or moderate pain. 30 tablet 3   alendronate (FOSAMAX) 70 MG tablet Take 70 mg by mouth every morning.     busPIRone  (BUSPAR ) 15 MG tablet TAKE 1 TABLET BY MOUTH 2 TIMES DAILY. 60 tablet 0   diclofenac (VOLTAREN) 75 MG EC tablet Take 75 mg by mouth 2 (two) times daily.     ezetimibe (ZETIA) 10 MG tablet Take 10 mg by mouth daily.     FLUoxetine  (PROZAC ) 20 MG capsule Take 1 capsule (20 mg total) by mouth  2 (two) times daily. 180 capsule 3   gabapentin (NEURONTIN) 300 MG capsule Take by mouth.     ipratropium (ATROVENT) 0.03 % nasal spray SPRAY 2 SPRAYS INTO EACH NOSTRIL TWICE A DAY     lansoprazole  (PREVACID  SOLUTAB) 30 MG disintegrating tablet Take 1 tablet (30 mg total) by mouth daily. 21 tablet 1   metoprolol  (TOPROL -XL) 50 MG 24 hr tablet Take 50 mg by mouth daily.        metoprolol  tartrate (LOPRESSOR ) 25 MG tablet TAKE 0.5 TABLETS (12.5 MG TOTAL) BY MOUTH 2 TIMES DAILY.     pantoprazole (PROTONIX) 40 MG tablet TAKE 1 TABLET BY MOUTH EVERYDAY AT BEDTIME     predniSONE (DELTASONE) 5 MG tablet TAKE 1 TABLET 3 TIMES A DAY X 5 DAYS THEN TWICE DAILY X 5 DAYS THEN 1 TAB EVERY DAY     primidone (MYSOLINE) 50 MG tablet Take 50 mg by mouth 2 (two) times daily.     rosuvastatin  (CRESTOR ) 10 MG tablet Take 10 mg by mouth daily.     SYMBICORT 160-4.5 MCG/ACT inhaler USE 2 INHALATIONS TWICE A DAY     No current facility-administered medications for this visit.    Medication Side Effects: None  Allergies:  Allergies  Allergen Reactions   Hydroxyzine     Other reaction(s): Other (See Comments) Sleepiness Nightmares    Prednisone Anxiety    Other reaction(s): Other (See Comments)   Rivaroxaban Other (See Comments)    Bleeding Bleeding    Clindamycin Diarrhea   Dicyclomine     Other reaction(s): Other (See Comments) Patient doesn't recall side effect   Doxepin    Duloxetine     Other reaction(s): Other (See Comments) Patient doesn't recall side effect   Erythromycin Base Diarrhea   Escitalopram     Other reaction(s): Other (See Comments) Intensifies ocd   Flecainide     Other reaction(s): Other (See Comments) weakness   Hydrocodone Bit-Homatrop Mbr Other (See Comments)    sleepy   Lurasidone     Other reaction(s): Other (See Comments) diskinesia   Paroxetine Hcl     Other reaction(s): Other (See Comments) Patient doesn't recall side effect   Tizanidine     Other reaction(s): Other (See Comments) Patient doesn't recall side effect   Amoxicillin Rash   Levofloxacin Diarrhea   Metformin And Related Diarrhea    Past Medical History:  Diagnosis Date   Anxiety    Asthma    Depression    GERD (gastroesophageal reflux disease)    Hypercholesterolemia    Hypertension    Obsessive compulsive disorder     Family History  Family history  unknown: Yes    Social History   Socioeconomic History   Marital status: Married    Spouse name: Not on file   Number of children: Not on file   Years of education: Not on file   Highest education level: Not on file  Occupational History   Not on file  Tobacco Use   Smoking status: Never   Smokeless tobacco: Never  Substance and Sexual Activity   Alcohol use: No   Drug use: No   Sexual activity: Not on file  Other Topics Concern   Not on file  Social History Narrative   Not on file   Social Drivers of Health   Financial Resource Strain: Not on file  Food Insecurity: Low Risk  (09/17/2023)   Received from [facility]   Hunger Vital Sign    Worried  About Running Out of Food in the Last Year: Never true    Ran Out of Food in the Last Year: Never true  Transportation Needs: No Transportation Needs (09/17/2023)   Received from [facility]    In the past 12 months, has lack of reliable transportation kept you from medical appointments, meetings, work or from getting things needed for daily living? : No  Physical Activity: Not on file  Stress: Not on file  Social Connections: Not on file  Intimate Partner Violence: Not on file    Past Medical History, Surgical history, Social history, and Family history were reviewed and updated as appropriate.   Please see review of systems for further details on the patient's review from today.   Objective:   Physical Exam:  There were no vitals taken for this visit.  Physical Exam Constitutional:      General: She is not in acute distress. Musculoskeletal:        General: No deformity.  Neurological:     Mental Status: She is alert and oriented to person, place, and time.     Coordination: Coordination normal.  Psychiatric:        Attention and Perception: Attention and perception normal. She does not perceive auditory or visual hallucinations.        Mood and Affect: Mood normal. Affect is not labile,  blunt, angry or inappropriate.        Speech: Speech normal.        Behavior: Behavior normal.        Thought Content: Thought content normal. Thought content is not paranoid or delusional. Thought content does not include homicidal or suicidal ideation. Thought content does not include homicidal or suicidal plan.        Cognition and Memory: Cognition and memory normal.        Judgment: Judgment normal.     Comments: Insight intact     Lab Review:     Component Value Date/Time   NA 136 02/16/2019 0553   K 3.7 02/16/2019 0553   CL 104 02/16/2019 0553   CO2 22 02/16/2019 0553   GLUCOSE 128 (H) 02/16/2019 0553   BUN 12 02/16/2019 0553   CREATININE 0.69 02/16/2019 0553   CALCIUM  8.1 (L) 02/16/2019 0553   PROT 6.7 02/15/2019 1251   ALBUMIN 3.6 02/15/2019 1251   AST 42 (H) 02/15/2019 1251   ALT 41 02/15/2019 1251   ALKPHOS 63 02/15/2019 1251   BILITOT 0.9 02/15/2019 1251   GFRNONAA >60 02/16/2019 0553   GFRAA >60 02/16/2019 0553       Component Value Date/Time   WBC 14.0 (H) 02/16/2019 0553   RBC 3.90 02/16/2019 0553   HGB 11.9 (L) 02/16/2019 0553   HCT 37.1 02/16/2019 0553   PLT 227 02/16/2019 0553   MCV 95.1 02/16/2019 0553   MCH 30.5 02/16/2019 0553   MCHC 32.1 02/16/2019 0553   RDW 12.6 02/16/2019 0553   LYMPHSABS 1.6 03/12/2011 1154   MONOABS 0.6 03/12/2011 1154   EOSABS 0.1 03/12/2011 1154   BASOSABS 0.0 03/12/2011 1154    No results found for: "POCLITH", "LITHIUM"   No results found for: "PHENYTOIN", "PHENOBARB", "VALPROATE", "CBMZ"   .res Assessment: Plan:    Plan:  Prozac  20mg  BID Buspar  15mg  BID Xanax  0.5mg  BID   RTC 2 weeks  25 minutes spent dedicated to the care of this patient on the date of this encounter to include pre-visit review of records, ordering of medication,  post visit documentation, and face-to-face time with the patient discussing anxiety, depression, OCD, and panic attacks. Discussed continuing current medication regimen.    Patient advised to contact office with any questions, adverse effects, or acute worsening in signs and symptoms.  Discussed potential benefits, risk, and side effects of benzodiazepines to include potential risk of tolerance and dependence, as well as possible drowsiness. Advised patient not to drive if experiencing drowsiness and to take lowest possible effective dose to minimize risk of dependence and tolerance.   There are no diagnoses linked to this encounter.  Please see After Visit Summary for patient specific instructions.  Future Appointments  Date Time Provider Department Center  12/06/2023 12:00 PM Kayla Welte Nattalie, NP [facility] None    No orders of the defined types were placed in this encounter.     -------------------------------

## 2023-12-20 ENCOUNTER — Telehealth: Admitting: Adult Health

## 2024-01-11 ENCOUNTER — Ambulatory Visit: Admitting: Adult Health

## 2024-01-11 ENCOUNTER — Encounter: Payer: Self-pay | Admitting: Adult Health

## 2024-01-11 DIAGNOSIS — F429 Obsessive-compulsive disorder, unspecified: Secondary | ICD-10-CM | POA: Diagnosis not present

## 2024-01-11 DIAGNOSIS — F32A Depression, unspecified: Secondary | ICD-10-CM | POA: Diagnosis not present

## 2024-01-11 DIAGNOSIS — F411 Generalized anxiety disorder: Secondary | ICD-10-CM

## 2024-01-11 DIAGNOSIS — F419 Anxiety disorder, unspecified: Secondary | ICD-10-CM

## 2024-01-11 DIAGNOSIS — F410 Panic disorder [episodic paroxysmal anxiety] without agoraphobia: Secondary | ICD-10-CM

## 2024-01-11 DIAGNOSIS — F331 Major depressive disorder, recurrent, moderate: Secondary | ICD-10-CM

## 2024-01-11 DIAGNOSIS — G2401 Drug induced subacute dyskinesia: Secondary | ICD-10-CM

## 2024-01-11 MED ORDER — ALPRAZOLAM 0.5 MG PO TABS
0.5000 mg | ORAL_TABLET | Freq: Two times a day (BID) | ORAL | 2 refills | Status: DC | PRN
Start: 2024-01-11 — End: 2024-04-28

## 2024-01-11 MED ORDER — BUSPIRONE HCL 15 MG PO TABS: 15.0000 mg | ORAL_TABLET | Freq: Two times a day (BID) | ORAL | 2 refills | Status: DC

## 2024-01-11 NOTE — Progress Notes (Signed)
 Kayla Beard [id number] 1945/11/25 78 y.o.  Virtual Visit via Telephone Note  I connected with pt on 01/11/24 at 12:00 PM EDT by telephone and verified that I am speaking with the correct person using two identifiers.   I discussed the limitations, risks, security and privacy concerns of performing an evaluation and management service by telephone and the availability of in person appointments. I also discussed with the patient that there may be a patient responsible charge related to this service. The patient expressed understanding and agreed to proceed.   I discussed the assessment and treatment plan with the patient. The patient was provided an opportunity to ask questions and all were answered. The patient agreed with the plan and demonstrated an understanding of the instructions.   The patient was advised to call back or seek an in-person evaluation if the symptoms worsen or if the condition fails to improve as anticipated.  I provided 25 minutes of non-face-to-face time during this encounter.  The patient was located at home.  The provider was located at [facility].   Reagan Camera, NP   Subjective:   Patient ID:  Kayla Beard is a 78 y.o. (DOB 1946/02/20) female.  Chief Complaint: No chief complaint on file.   HPI Kayla Beard presents for follow-up of anxiety, depression, OCD, TD and panic attacks.  Describes mood today as "ok". Pleasant. Reports tearfulness at times. Mood symptoms - denies depression. Reports improved interest and motivation. Reports feeling anxious at times - "having small spells". Reports less irritability - "has stopped watching the news". Denies recent panic attacks. Reports worry, rumination and over thinking "sometimes". Reports obsessive thoughts and acts - "checking behaviors". Mood is stable. Stating "I feel like I'm doing ok". Feels like medications are helpful. Taking medications as prescribed. Energy levels improved. Reports improved  mobility. Active, does not have a regular exercise routine with current physical disabilities. Reports she enjoys some usual interests and activities. Married. Lives with husband. Has 2 grown sons - grandchildren. Spending time with family and friends. Talking to a friend daily. Attends church.  Appetite adequate. Weight stable - 136 pounds.  Reports sleeping well most nights - going to bed between 7 and 8 pm and sleeps about 10 to 12 hours. Reports some daytime napping. Reports improved focus and concentration difficulties. Reports doing Bible study. Talking on the phone with friend every day. Completing tasks. Managing minimal aspects of household - putting clothes away. Retired - disabled.  Denies SI or HI.  Denies AH or VH. Denies self harm. Denies substance use.   Review of Systems:  Review of Systems  Musculoskeletal:  Negative for gait problem.  Neurological:  Negative for tremors.  Psychiatric/Behavioral:         Please refer to HPI    Medications: I have reviewed the patient's current medications.  Current Outpatient Medications  Medication Sig Dispense Refill   acetaminophen  (TYLENOL ) 325 MG tablet Take 2 tablets (650 mg total) by mouth every 6 (six) hours as needed for mild pain or moderate pain. 30 tablet 3   alendronate (FOSAMAX) 70 MG tablet Take 70 mg by mouth every morning.     ALPRAZolam  (XANAX ) 0.5 MG tablet Take 1 tablet (0.5 mg total) by mouth 2 (two) times daily as needed for anxiety. 60 tablet 2   busPIRone  (BUSPAR ) 15 MG tablet TAKE 1 TABLET BY MOUTH 2 TIMES DAILY. 60 tablet 0   diclofenac (VOLTAREN) 75 MG EC tablet Take 75 mg by mouth 2 (two) times  daily.     ezetimibe (ZETIA) 10 MG tablet Take 10 mg by mouth daily.     FLUoxetine  (PROZAC ) 20 MG capsule Take 1 capsule (20 mg total) by mouth 2 (two) times daily. 180 capsule 3   gabapentin (NEURONTIN) 300 MG capsule Take by mouth.     ipratropium (ATROVENT) 0.03 % nasal spray SPRAY 2 SPRAYS INTO EACH NOSTRIL TWICE  A DAY     lansoprazole  (PREVACID  SOLUTAB) 30 MG disintegrating tablet Take 1 tablet (30 mg total) by mouth daily. 21 tablet 1   metoprolol  (TOPROL -XL) 50 MG 24 hr tablet Take 50 mg by mouth daily.       metoprolol  tartrate (LOPRESSOR ) 25 MG tablet TAKE 0.5 TABLETS (12.5 MG TOTAL) BY MOUTH 2 TIMES DAILY.     pantoprazole (PROTONIX) 40 MG tablet TAKE 1 TABLET BY MOUTH EVERYDAY AT BEDTIME     predniSONE (DELTASONE) 5 MG tablet TAKE 1 TABLET 3 TIMES A DAY X 5 DAYS THEN TWICE DAILY X 5 DAYS THEN 1 TAB EVERY DAY     primidone (MYSOLINE) 50 MG tablet Take 50 mg by mouth 2 (two) times daily.     rosuvastatin  (CRESTOR ) 10 MG tablet Take 10 mg by mouth daily.     SYMBICORT 160-4.5 MCG/ACT inhaler USE 2 INHALATIONS TWICE A DAY     No current facility-administered medications for this visit.    Medication Side Effects: None  Allergies:  Allergies  Allergen Reactions   Hydroxyzine     Other reaction(s): Other (See Comments) Sleepiness Nightmares    Prednisone Anxiety    Other reaction(s): Other (See Comments)   Rivaroxaban Other (See Comments)    Bleeding Bleeding    Clindamycin Diarrhea   Dicyclomine     Other reaction(s): Other (See Comments) Patient doesn't recall side effect   Doxepin    Duloxetine     Other reaction(s): Other (See Comments) Patient doesn't recall side effect   Erythromycin Base Diarrhea   Escitalopram     Other reaction(s): Other (See Comments) Intensifies ocd   Flecainide     Other reaction(s): Other (See Comments) weakness   Hydrocodone Bit-Homatrop Mbr Other (See Comments)    sleepy   Lurasidone     Other reaction(s): Other (See Comments) diskinesia   Paroxetine Hcl     Other reaction(s): Other (See Comments) Patient doesn't recall side effect   Tizanidine     Other reaction(s): Other (See Comments) Patient doesn't recall side effect   Amoxicillin Rash   Levofloxacin Diarrhea   Metformin And Related Diarrhea    Past Medical History:  Diagnosis  Date   Anxiety    Asthma    Depression    GERD (gastroesophageal reflux disease)    Hypercholesterolemia    Hypertension    Obsessive compulsive disorder     Family History  Family history unknown: Yes    Social History   Socioeconomic History   Marital status: Married    Spouse name: Not on file   Number of children: Not on file   Years of education: Not on file   Highest education level: Not on file  Occupational History   Not on file  Tobacco Use   Smoking status: Never   Smokeless tobacco: Never  Substance and Sexual Activity   Alcohol use: No   Drug use: No   Sexual activity: Not on file  Other Topics Concern   Not on file  Social History Narrative   Not on file   Social  Drivers of Corporate investment banker Strain: Not on file  Food Insecurity: Low Risk  (09/17/2023)   Received from [facility]   Hunger Vital Sign    Worried About Running Out of Food in the Last Year: Never true    Ran Out of Food in the Last Year: Never true  Transportation Needs: No Transportation Needs (09/17/2023)   Received from [facility]    In the past 12 months, has lack of reliable transportation kept you from medical appointments, meetings, work or from getting things needed for daily living? : No  Physical Activity: Not on file  Stress: Not on file  Social Connections: Not on file  Intimate Partner Violence: Not on file    Past Medical History, Surgical history, Social history, and Family history were reviewed and updated as appropriate.   Please see review of systems for further details on the patient's review from today.   Objective:   Physical Exam:  There were no vitals taken for this visit.  Physical Exam Constitutional:      General: She is not in acute distress. Musculoskeletal:        General: No deformity.  Neurological:     Mental Status: She is alert and oriented to person, place, and time.     Coordination: Coordination normal.   Psychiatric:        Attention and Perception: Attention and perception normal. She does not perceive auditory or visual hallucinations.        Mood and Affect: Mood normal. Mood is not anxious or depressed. Affect is not labile, blunt, angry or inappropriate.        Speech: Speech normal.        Behavior: Behavior normal.        Thought Content: Thought content normal. Thought content is not paranoid or delusional. Thought content does not include homicidal or suicidal ideation. Thought content does not include homicidal or suicidal plan.        Cognition and Memory: Cognition and memory normal.        Judgment: Judgment normal.     Comments: Insight intact     Lab Review:     Component Value Date/Time   NA 136 02/16/2019 0553   K 3.7 02/16/2019 0553   CL 104 02/16/2019 0553   CO2 22 02/16/2019 0553   GLUCOSE 128 (H) 02/16/2019 0553   BUN 12 02/16/2019 0553   CREATININE 0.69 02/16/2019 0553   CALCIUM  8.1 (L) 02/16/2019 0553   PROT 6.7 02/15/2019 1251   ALBUMIN 3.6 02/15/2019 1251   AST 42 (H) 02/15/2019 1251   ALT 41 02/15/2019 1251   ALKPHOS 63 02/15/2019 1251   BILITOT 0.9 02/15/2019 1251   GFRNONAA >60 02/16/2019 0553   GFRAA >60 02/16/2019 0553       Component Value Date/Time   WBC 14.0 (H) 02/16/2019 0553   RBC 3.90 02/16/2019 0553   HGB 11.9 (L) 02/16/2019 0553   HCT 37.1 02/16/2019 0553   PLT 227 02/16/2019 0553   MCV 95.1 02/16/2019 0553   MCH 30.5 02/16/2019 0553   MCHC 32.1 02/16/2019 0553   RDW 12.6 02/16/2019 0553   LYMPHSABS 1.6 03/12/2011 1154   MONOABS 0.6 03/12/2011 1154   EOSABS 0.1 03/12/2011 1154   BASOSABS 0.0 03/12/2011 1154    No results found for: "POCLITH", "LITHIUM"   No results found for: "PHENYTOIN", "PHENOBARB", "VALPROATE", "CBMZ"   .res Assessment: Plan:    Plan:  Prozac   20mg  BID Buspar  15mg  BID Xanax  0.5mg  BID   RTC 4 weeks  25 minutes spent dedicated to the care of this patient on the date of this encounter to include  pre-visit review of records, ordering of medication, post visit documentation, and face-to-face time with the patient discussing anxiety, depression, OCD and panic attacks. Discussed continuing current medication regimen.   Patient advised to contact office with any questions, adverse effects, or acute worsening in signs and symptoms.  Discussed potential benefits, risk, and side effects of benzodiazepines to include potential risk of tolerance and dependence, as well as possible drowsiness. Advised patient not to drive if experiencing drowsiness and to take lowest possible effective dose to minimize risk of dependence and tolerance.  There are no diagnoses linked to this encounter.   Please see After Visit Summary for patient specific instructions.  Future Appointments  Date Time Provider Department Center  01/11/2024 12:00 PM Dominic Rhome Nattalie, NP [facility] None    No orders of the defined types were placed in this encounter.     -------------------------------

## 2024-01-13 ENCOUNTER — Telehealth: Payer: Self-pay | Admitting: Adult Health

## 2024-01-13 NOTE — Telephone Encounter (Signed)
 Patient seen earlier this week. Said she has a family member dying and asking if anything can be done with her meds to help her deal with this.  Prozac  20mg  BID Buspar  15mg  BID Xanax  0.5mg  BID

## 2024-01-13 NOTE — Telephone Encounter (Signed)
 Pt called at 9:11a stating that she wanted Bonnell Butcher to get a message that she has a death coming up in her family; the person has been moved to hospice and pt said she shaking and clicking her teeth.  She wants to know if there is anything that can be done with her medications.  No upcoming appt scheduled.

## 2024-01-13 NOTE — Telephone Encounter (Signed)
Patient notified of recommendation.

## 2024-01-18 ENCOUNTER — Telehealth: Payer: Self-pay | Admitting: Adult Health

## 2024-01-18 NOTE — Telephone Encounter (Signed)
 Pt reports she saw PCP yesterday and asked him about something for TD. He mentioned Austedo. She called insurance and was told cost was $800. She reports her cardiologist didn't think that it was a good medication for her. She has tachy-brady syndrome. She is going to see her dentist tomorrow and said he might make her a device to help. She reports it starts in the AM, gets better in the afternoon, and then worsens in the evening.

## 2024-01-18 NOTE — Telephone Encounter (Signed)
 Will call patient. She is not on Auvelity.

## 2024-01-18 NOTE — Telephone Encounter (Signed)
 Carollynn Cirri called this morning and spoke with [employer]. She said she needed Auvility 3 mg and needs it to be a new RX. Ann spoke with Raina Bunting and was told that it would cost $800.00 a month. She cannot afford this Does she have another option in ordering this Auvility? Her phone number is [phone].  Pharmacy is:  [id number] - [place], [place] - [address]   Phone: [phone]  Fax: [phone]

## 2024-01-31 ENCOUNTER — Telehealth: Payer: Self-pay | Admitting: Adult Health

## 2024-01-31 NOTE — Telephone Encounter (Signed)
 Pt called @ 11:09 am asking you to return her call tomorrow @ [phone]

## 2024-02-08 ENCOUNTER — Telehealth: Admitting: Adult Health

## 2024-02-08 ENCOUNTER — Telehealth: Payer: Self-pay

## 2024-02-08 NOTE — Telephone Encounter (Signed)
 Reviewed meds with patient in preparation for visit Friday. She said price is a concern.   Fluoxetine  20 mg BID Buspirone  15 mg BID Alprazolam  0.5 mg BID Gabapentin 300 mg 1 at bedtime Metoprolol  12.5 mg BID Align probiotic at bedtime Vitamin D3 1000 units every day Imodium prn Occasional OTC Tylenol   Reports seeing her orthopedist tomorrow and is going to see about getting Omega-XL. May get a knee injection.

## 2024-02-11 ENCOUNTER — Encounter: Payer: Self-pay | Admitting: Adult Health

## 2024-02-11 ENCOUNTER — Ambulatory Visit (INDEPENDENT_AMBULATORY_CARE_PROVIDER_SITE_OTHER): Admitting: Adult Health

## 2024-02-11 DIAGNOSIS — F429 Obsessive-compulsive disorder, unspecified: Secondary | ICD-10-CM

## 2024-02-11 DIAGNOSIS — G2401 Drug induced subacute dyskinesia: Secondary | ICD-10-CM

## 2024-02-11 DIAGNOSIS — F419 Anxiety disorder, unspecified: Secondary | ICD-10-CM

## 2024-02-11 DIAGNOSIS — F410 Panic disorder [episodic paroxysmal anxiety] without agoraphobia: Secondary | ICD-10-CM | POA: Diagnosis not present

## 2024-02-11 DIAGNOSIS — F32A Depression, unspecified: Secondary | ICD-10-CM

## 2024-02-11 DIAGNOSIS — F331 Major depressive disorder, recurrent, moderate: Secondary | ICD-10-CM

## 2024-02-11 DIAGNOSIS — F411 Generalized anxiety disorder: Secondary | ICD-10-CM

## 2024-02-11 NOTE — Progress Notes (Signed)
 Kayla Beard [id number] 18-Feb-1946 78 y.o.  Virtual Visit via Telephone Note  I connected with pt on 02/11/24 at 12:00 PM EDT by telephone and verified that I am speaking with the correct person using two identifiers.   I discussed the limitations, risks, security and privacy concerns of performing an evaluation and management service by telephone and the availability of in person appointments. I also discussed with the patient that there may be a patient responsible charge related to this service. The patient expressed understanding and agreed to proceed.   I discussed the assessment and treatment plan with the patient. The patient was provided an opportunity to ask questions and all were answered. The patient agreed with the plan and demonstrated an understanding of the instructions.   The patient was advised to call back or seek an in-person evaluation if the symptoms worsen or if the condition fails to improve as anticipated.  I provided 25 minutes of non-face-to-face time during this encounter.  The patient was located at home.  The provider was located at [facility].   Kayla LOISE Sayers, NP   Subjective:   Patient ID:  Kayla Beard is a 78 y.o. (DOB Oct 30, 1945) female.  Chief Complaint: No chief complaint on file.   HPI Kayla Beard presents for follow-up of anxiety, depression, OCD, TD and panic attacks.  Describes mood today as ok. Pleasant. Reports tearfulness at times. Mood symptoms - denies depression. Reports improved interest and motivation. Reports feeling anxious at times. Reports decreased irritability. Denies recent panic attacks. Reports some worry, rumination and over thinking. Reports obsessive thoughts and acts - checking behaviors. Reports ongoing issues with TD symptoms and would like to consider treatment options. Mood is stable. Stating I feel like I'm doing alright. Feels like medications are helpful. Taking medications as prescribed. Energy levels  improved. Active, does not have a regular exercise routine with current physical disabilities.  Reports she enjoys some usual interests and activities. Married. Lives with husband. Has 2 grown sons - grandchildren. Spending time with family and friends. Talking to a friend daily. Attends church.  Appetite adequate. Weight stable - 136 pounds.  Reports sleeping well most nights - sleeps about 10 to 12 hours. Reports some daytime napping. Reports improved focus and concentration difficulties. Reports doing Bible study. Talking on the phone with her friend every day. Completing tasks. Managing minimal aspects of household. Retired - disabled.  Denies SI or HI.  Denies AH or VH. Denies self harm. Denies substance use.  Review of Systems:  Review of Systems  Musculoskeletal:  Negative for gait problem.  Neurological:  Negative for tremors.  Psychiatric/Behavioral:         Please refer to HPI    Medications: I have reviewed the patient's current medications.  Current Outpatient Medications  Medication Sig Dispense Refill   acetaminophen  (TYLENOL ) 325 MG tablet Take 2 tablets (650 mg total) by mouth every 6 (six) hours as needed for mild pain or moderate pain. 30 tablet 3   alendronate (FOSAMAX) 70 MG tablet Take 70 mg by mouth every morning.     ALPRAZolam  (XANAX ) 0.5 MG tablet Take 1 tablet (0.5 mg total) by mouth 2 (two) times daily as needed for anxiety. 60 tablet 2   busPIRone  (BUSPAR ) 15 MG tablet Take 1 tablet (15 mg total) by mouth 2 (two) times daily. 60 tablet 2   diclofenac (VOLTAREN) 75 MG EC tablet Take 75 mg by mouth 2 (two) times daily.     ezetimibe (ZETIA) 10  MG tablet Take 10 mg by mouth daily.     FLUoxetine  (PROZAC ) 20 MG capsule Take 1 capsule (20 mg total) by mouth 2 (two) times daily. 180 capsule 3   gabapentin (NEURONTIN) 300 MG capsule Take by mouth.     ipratropium (ATROVENT) 0.03 % nasal spray SPRAY 2 SPRAYS INTO EACH NOSTRIL TWICE A DAY     lansoprazole  (PREVACID   SOLUTAB) 30 MG disintegrating tablet Take 1 tablet (30 mg total) by mouth daily. 21 tablet 1   metoprolol  (TOPROL -XL) 50 MG 24 hr tablet Take 50 mg by mouth daily.       metoprolol  tartrate (LOPRESSOR ) 25 MG tablet TAKE 0.5 TABLETS (12.5 MG TOTAL) BY MOUTH 2 TIMES DAILY.     pantoprazole (PROTONIX) 40 MG tablet TAKE 1 TABLET BY MOUTH EVERYDAY AT BEDTIME     predniSONE (DELTASONE) 5 MG tablet TAKE 1 TABLET 3 TIMES A DAY X 5 DAYS THEN TWICE DAILY X 5 DAYS THEN 1 TAB EVERY DAY     primidone (MYSOLINE) 50 MG tablet Take 50 mg by mouth 2 (two) times daily.     rosuvastatin  (CRESTOR ) 10 MG tablet Take 10 mg by mouth daily.     SYMBICORT 160-4.5 MCG/ACT inhaler USE 2 INHALATIONS TWICE A DAY     No current facility-administered medications for this visit.    Medication Side Effects: None  Allergies:  Allergies  Allergen Reactions   Hydroxyzine     Other reaction(s): Other (See Comments) Sleepiness Nightmares    Prednisone Anxiety    Other reaction(s): Other (See Comments)   Rivaroxaban Other (See Comments)    Bleeding Bleeding    Clindamycin Diarrhea   Dicyclomine     Other reaction(s): Other (See Comments) Patient doesn't recall side effect   Doxepin    Duloxetine     Other reaction(s): Other (See Comments) Patient doesn't recall side effect   Erythromycin Base Diarrhea   Escitalopram     Other reaction(s): Other (See Comments) Intensifies ocd   Flecainide     Other reaction(s): Other (See Comments) weakness   Hydrocodone Bit-Homatrop Mbr Other (See Comments)    sleepy   Lurasidone     Other reaction(s): Other (See Comments) diskinesia   Paroxetine Hcl     Other reaction(s): Other (See Comments) Patient doesn't recall side effect   Tizanidine     Other reaction(s): Other (See Comments) Patient doesn't recall side effect   Amoxicillin Rash   Levofloxacin Diarrhea   Metformin And Related Diarrhea    Past Medical History:  Diagnosis Date   Anxiety    Asthma     Depression    GERD (gastroesophageal reflux disease)    Hypercholesterolemia    Hypertension    Obsessive compulsive disorder     Family History  Family history unknown: Yes    Social History   Socioeconomic History   Marital status: Married    Spouse name: Not on file   Number of children: Not on file   Years of education: Not on file   Highest education level: Not on file  Occupational History   Not on file  Tobacco Use   Smoking status: Never   Smokeless tobacco: Never  Substance and Sexual Activity   Alcohol use: No   Drug use: No   Sexual activity: Not on file  Other Topics Concern   Not on file  Social History Narrative   Not on file   Social Drivers of Health   Financial Resource Strain:  Not on file  Food Insecurity: Low Risk  (09/17/2023)   Received from [facility]   Hunger Vital Sign    Within the past 12 months, you worried that your food would run out before you got money to buy more: Never true    Within the past 12 months, the food you bought just didn't last and you didn't have money to get more. : Never true  Transportation Needs: No Transportation Needs (09/17/2023)   Received from [facility]    In the past 12 months, has lack of reliable transportation kept you from medical appointments, meetings, work or from getting things needed for daily living? : No  Physical Activity: Not on file  Stress: Not on file  Social Connections: Not on file  Intimate Partner Violence: Not on file    Past Medical History, Surgical history, Social history, and Family history were reviewed and updated as appropriate.   Please see review of systems for further details on the patient's review from today.   Objective:   Physical Exam:  There were no vitals taken for this visit.  Physical Exam Constitutional:      General: She is not in acute distress.  Musculoskeletal:        General: No deformity.   Neurological:     Mental Status: She  is alert and oriented to person, place, and time.     Coordination: Coordination normal.   Psychiatric:        Attention and Perception: Attention and perception normal. She does not perceive auditory or visual hallucinations.        Mood and Affect: Mood normal. Mood is not anxious or depressed. Affect is not labile, blunt, angry or inappropriate.        Speech: Speech normal.        Behavior: Behavior normal.        Thought Content: Thought content normal. Thought content is not paranoid or delusional. Thought content does not include homicidal or suicidal ideation. Thought content does not include homicidal or suicidal plan.        Cognition and Memory: Cognition and memory normal.        Judgment: Judgment normal.     Comments: Insight intact     Lab Review:     Component Value Date/Time   NA 136 02/16/2019 0553   K 3.7 02/16/2019 0553   CL 104 02/16/2019 0553   CO2 22 02/16/2019 0553   GLUCOSE 128 (H) 02/16/2019 0553   BUN 12 02/16/2019 0553   CREATININE 0.69 02/16/2019 0553   CALCIUM  8.1 (L) 02/16/2019 0553   PROT 6.7 02/15/2019 1251   ALBUMIN 3.6 02/15/2019 1251   AST 42 (H) 02/15/2019 1251   ALT 41 02/15/2019 1251   ALKPHOS 63 02/15/2019 1251   BILITOT 0.9 02/15/2019 1251   GFRNONAA >60 02/16/2019 0553   GFRAA >60 02/16/2019 0553       Component Value Date/Time   WBC 14.0 (H) 02/16/2019 0553   RBC 3.90 02/16/2019 0553   HGB 11.9 (L) 02/16/2019 0553   HCT 37.1 02/16/2019 0553   PLT 227 02/16/2019 0553   MCV 95.1 02/16/2019 0553   MCH 30.5 02/16/2019 0553   MCHC 32.1 02/16/2019 0553   RDW 12.6 02/16/2019 0553   LYMPHSABS 1.6 03/12/2011 1154   MONOABS 0.6 03/12/2011 1154   EOSABS 0.1 03/12/2011 1154   BASOSABS 0.0 03/12/2011 1154    No results found for: POCLITH, LITHIUM   No  results found for: PHENYTOIN, PHENOBARB, VALPROATE, CBMZ   .res Assessment: Plan:    Plan:  Discussed with Dr Geoffry - will add low dose of pramipexole 0.125 daily  for TD symptoms.  Prozac  20mg  BID Buspar  15mg  BID Xanax  0.5mg  BID   RTC 4 weeks  25 minutes spent dedicated to the care of this patient on the date of this encounter to include pre-visit review of records, ordering of medication, post visit documentation, and face-to-face time with the patient discussing anxiety, depression, OCD and panic attacks. Discussed continuing current medication regimen.   Patient advised to contact office with any questions, adverse effects, or acute worsening in signs and symptoms.  Discussed potential benefits, risk, and side effects of benzodiazepines to include potential risk of tolerance and dependence, as well as possible drowsiness. Advised patient not to drive if experiencing drowsiness and to take lowest possible effective dose to minimize risk of dependence and tolerance.  There are no diagnoses linked to this encounter.  Please see After Visit Summary for patient specific instructions.  No future appointments.   No orders of the defined types were placed in this encounter.     -------------------------------

## 2024-02-23 ENCOUNTER — Telehealth: Payer: Self-pay | Admitting: Adult Health

## 2024-02-23 MED ORDER — PRAMIPEXOLE DIHYDROCHLORIDE 0.125 MG PO TABS
0.1250 mg | ORAL_TABLET | Freq: Every day | ORAL | 1 refills | Status: DC
Start: 2024-02-23 — End: 2024-04-09

## 2024-02-23 NOTE — Telephone Encounter (Signed)
 Pt is asking if she can take alprazolam  and pramipexole . Is concerned about possible interaction. I know she is on a low dose of both medications.

## 2024-02-23 NOTE — Telephone Encounter (Signed)
 Pt called in wanting to tell Tillman about the teeth clicking and hand tremors has been experiencing. Said she would call her PCP about taking Propranolol today but still wanted to speak with Tillman.

## 2024-02-23 NOTE — Telephone Encounter (Signed)
 Patient advised to ask pharmacist about possible interaction with alprazolam  and pramipexole .

## 2024-02-23 NOTE — Telephone Encounter (Signed)
 Patient called in regarding medication. She states that she has been taking Alprazolam  and was recently prescribed Pramipexole  0.125mg . She has some concerns about taking medications together and asked if she should decrease the Alprazolam  or stop taking all together so she can start the Pramipexole . She also states that she has been suffering from hand tremors and teeth clicking. Pls rtc to discuss [phone]

## 2024-02-23 NOTE — Telephone Encounter (Signed)
 Rx for pramipexole  sent. Told her to call next week and let us  know how she was doing.

## 2024-03-07 ENCOUNTER — Other Ambulatory Visit: Payer: Self-pay | Admitting: Adult Health

## 2024-03-10 ENCOUNTER — Telehealth: Admitting: Adult Health

## 2024-03-16 ENCOUNTER — Other Ambulatory Visit: Payer: Self-pay | Admitting: Adult Health

## 2024-03-16 MED ORDER — PRAMIPEXOLE DIHYDROCHLORIDE 0.25 MG PO TABS: 0.2500 mg | ORAL_TABLET | Freq: Every day | ORAL | 0 refills | Status: DC

## 2024-04-09 ENCOUNTER — Other Ambulatory Visit: Payer: Self-pay | Admitting: Adult Health

## 2024-04-28 ENCOUNTER — Encounter: Payer: Self-pay | Admitting: Adult Health

## 2024-04-28 ENCOUNTER — Ambulatory Visit (INDEPENDENT_AMBULATORY_CARE_PROVIDER_SITE_OTHER): Admitting: Adult Health

## 2024-04-28 DIAGNOSIS — F429 Obsessive-compulsive disorder, unspecified: Secondary | ICD-10-CM | POA: Diagnosis not present

## 2024-04-28 DIAGNOSIS — F32A Depression, unspecified: Secondary | ICD-10-CM

## 2024-04-28 DIAGNOSIS — F410 Panic disorder [episodic paroxysmal anxiety] without agoraphobia: Secondary | ICD-10-CM

## 2024-04-28 DIAGNOSIS — F419 Anxiety disorder, unspecified: Secondary | ICD-10-CM | POA: Diagnosis not present

## 2024-04-28 DIAGNOSIS — F411 Generalized anxiety disorder: Secondary | ICD-10-CM

## 2024-04-28 DIAGNOSIS — F331 Major depressive disorder, recurrent, moderate: Secondary | ICD-10-CM

## 2024-04-28 DIAGNOSIS — G2401 Drug induced subacute dyskinesia: Secondary | ICD-10-CM

## 2024-04-28 MED ORDER — PRAMIPEXOLE DIHYDROCHLORIDE 0.25 MG PO TABS: 0.2500 mg | ORAL_TABLET | Freq: Every day | ORAL | 2 refills | Status: DC

## 2024-04-28 MED ORDER — ALPRAZOLAM 0.5 MG PO TABS: 0.5000 mg | ORAL_TABLET | Freq: Two times a day (BID) | ORAL | 2 refills | Status: DC | PRN

## 2024-04-28 MED ORDER — BUSPIRONE HCL 15 MG PO TABS: 15.0000 mg | ORAL_TABLET | Freq: Two times a day (BID) | ORAL | 2 refills | Status: DC

## 2024-04-28 NOTE — Progress Notes (Signed)
 Kayla Beard [id number] [birth year] 78 y.o.  Virtual Visit via Telephone Note  I connected with pt on 04/28/24 at  1:00 PM EDT by telephone and verified that I am speaking with the correct person using two identifiers.   I discussed the limitations, risks, security and privacy concerns of performing an evaluation and management service by telephone and the availability of in person appointments. I also discussed with the patient that there may be a patient responsible charge related to this service. The patient expressed understanding and agreed to proceed.   I discussed the assessment and treatment plan with the patient. The patient was provided an opportunity to ask questions and all were answered. The patient agreed with the plan and demonstrated an understanding of the instructions.   The patient was advised to call back or seek an in-person evaluation if the symptoms worsen or if the condition fails to improve as anticipated.  I provided 25 minutes of non-face-to-face time during this encounter.  The patient was located at home.  The provider was located at [facility].   Angeline LOISE Sayers, NP   Subjective:   Patient ID:  Kayla Beard is a 78 y.o. (DOB 03-May-1946) female.  Chief Complaint: No chief complaint on file.   HPI Kayla Beard presents for follow-up of anxiety, depression, OCD, TD and panic attacks.  Describes mood today as ok. Pleasant. Reports tearfulness at times. Mood symptoms - denies depression. Reports stable interest and motivation. Reports feeling less anxious. Reports decreased irritability. Denies recent panic attacks. Reports some worry, rumination and over thinking. Reports obsessive thoughts and acts - checking behaviors. Reports decreased issues with TD symptoms with starting the Mirapex . Reports she and husband are recovering from Covid. Reports mood is stable. Stating I feel like I'm doing better. Feels like medications are helpful. Taking  medications as prescribed. Energy levels improved. Active, does not have a regular exercise routine with current physical disabilities.  Reports she enjoys some usual interests and activities. Married. Lives with husband. Has 2 grown sons - grandchildren. Spending time with family and friends. Talking to a friend daily. Attends church.  Appetite adequate. Weight loss - 126 from 136 pounds.  Reports sleeping well most nights. Averages 10 to 12 hours. Reports some daytime napping. Reports improved focus and concentration difficulties. Reports attends Bible study. Talking on the phone with her best friend every day. Completing tasks. Managing minimal aspects of household. Retired - disabled.  Denies SI or HI.  Denies AH or VH. Denies self harm. Denies substance use.  Review of Systems:  Review of Systems  Musculoskeletal:  Negative for gait problem.  Neurological:  Negative for tremors.  Psychiatric/Behavioral:         Please refer to HPI    Medications: I have reviewed the patient's current medications.  Current Outpatient Medications  Medication Sig Dispense Refill   acetaminophen  (TYLENOL ) 325 MG tablet Take 2 tablets (650 mg total) by mouth every 6 (six) hours as needed for mild pain or moderate pain. 30 tablet 3   alendronate (FOSAMAX) 70 MG tablet Take 70 mg by mouth every morning.     ALPRAZolam  (XANAX ) 0.5 MG tablet Take 1 tablet (0.5 mg total) by mouth 2 (two) times daily as needed for anxiety. 60 tablet 2   busPIRone  (BUSPAR ) 15 MG tablet Take 1 tablet (15 mg total) by mouth 2 (two) times daily. 60 tablet 2   diclofenac (VOLTAREN) 75 MG EC tablet Take 75 mg by mouth 2 (two) times daily.  ezetimibe (ZETIA) 10 MG tablet Take 10 mg by mouth daily.     FLUoxetine  (PROZAC ) 20 MG capsule Take 1 capsule (20 mg total) by mouth 2 (two) times daily. 180 capsule 3   gabapentin (NEURONTIN) 300 MG capsule Take by mouth.     ipratropium (ATROVENT) 0.03 % nasal spray SPRAY 2 SPRAYS INTO  EACH NOSTRIL TWICE A DAY     lansoprazole  (PREVACID  SOLUTAB) 30 MG disintegrating tablet Take 1 tablet (30 mg total) by mouth daily. 21 tablet 1   metoprolol  (TOPROL -XL) 50 MG 24 hr tablet Take 50 mg by mouth daily.       metoprolol  tartrate (LOPRESSOR ) 25 MG tablet TAKE 0.5 TABLETS (12.5 MG TOTAL) BY MOUTH 2 TIMES DAILY.     pantoprazole (PROTONIX) 40 MG tablet TAKE 1 TABLET BY MOUTH EVERYDAY AT BEDTIME     pramipexole  (MIRAPEX ) 0.25 MG tablet TAKE 1 TABLET BY MOUTH EVERY DAY 30 tablet 0   predniSONE (DELTASONE) 5 MG tablet TAKE 1 TABLET 3 TIMES A DAY X 5 DAYS THEN TWICE DAILY X 5 DAYS THEN 1 TAB EVERY DAY     primidone (MYSOLINE) 50 MG tablet Take 50 mg by mouth 2 (two) times daily.     rosuvastatin  (CRESTOR ) 10 MG tablet Take 10 mg by mouth daily.     SYMBICORT 160-4.5 MCG/ACT inhaler USE 2 INHALATIONS TWICE A DAY     No current facility-administered medications for this visit.    Medication Side Effects: None  Allergies:  Allergies  Allergen Reactions   Hydroxyzine     Other reaction(s): Other (See Comments) Sleepiness Nightmares    Prednisone Anxiety    Other reaction(s): Other (See Comments)   Rivaroxaban Other (See Comments)    Bleeding Bleeding    Clindamycin Diarrhea   Dicyclomine     Other reaction(s): Other (See Comments) Patient doesn't recall side effect   Doxepin    Duloxetine     Other reaction(s): Other (See Comments) Patient doesn't recall side effect   Erythromycin Base Diarrhea   Escitalopram     Other reaction(s): Other (See Comments) Intensifies ocd   Flecainide     Other reaction(s): Other (See Comments) weakness   Hydrocodone Bit-Homatrop Mbr Other (See Comments)    sleepy   Lurasidone     Other reaction(s): Other (See Comments) diskinesia   Paroxetine Hcl     Other reaction(s): Other (See Comments) Patient doesn't recall side effect   Tizanidine     Other reaction(s): Other (See Comments) Patient doesn't recall side effect   Amoxicillin  Rash   Levofloxacin Diarrhea   Metformin And Related Diarrhea    Past Medical History:  Diagnosis Date   Anxiety    Asthma    Depression    GERD (gastroesophageal reflux disease)    Hypercholesterolemia    Hypertension    Obsessive compulsive disorder     Family History  Family history unknown: Yes    Social History   Socioeconomic History   Marital status: Married    Spouse name: Not on file   Number of children: Not on file   Years of education: Not on file   Highest education level: Not on file  Occupational History   Not on file  Tobacco Use   Smoking status: Never   Smokeless tobacco: Never  Substance and Sexual Activity   Alcohol use: No   Drug use: No   Sexual activity: Not on file  Other Topics Concern   Not on file  Social History Narrative   Not on file   Social Drivers of Health   Financial Resource Strain: Not on file  Food Insecurity: Low Risk  (09/17/2023)   Received from [facility]   Hunger Vital Sign    Within the past 12 months, you worried that your food would run out before you got money to buy more: Never true    Within the past 12 months, the food you bought just didn't last and you didn't have money to get more. : Never true  Transportation Needs: No Transportation Needs (09/17/2023)   Received from [facility]    In the past 12 months, has lack of reliable transportation kept you from medical appointments, meetings, work or from getting things needed for daily living? : No  Physical Activity: Not on file  Stress: Not on file  Social Connections: Not on file  Intimate Partner Violence: Not on file    Past Medical History, Surgical history, Social history, and Family history were reviewed and updated as appropriate.   Please see review of systems for further details on the patient's review from today.   Objective:   Physical Exam:  There were no vitals taken for this visit.  Physical Exam Constitutional:       General: She is not in acute distress. Musculoskeletal:        General: No deformity.  Neurological:     Mental Status: She is alert and oriented to person, place, and time.     Coordination: Coordination normal.  Psychiatric:        Attention and Perception: Attention and perception normal. She does not perceive auditory or visual hallucinations.        Mood and Affect: Mood normal. Mood is not anxious or depressed. Affect is not labile, blunt, angry or inappropriate.        Speech: Speech normal.        Behavior: Behavior normal.        Thought Content: Thought content normal. Thought content is not paranoid or delusional. Thought content does not include homicidal or suicidal ideation. Thought content does not include homicidal or suicidal plan.        Cognition and Memory: Cognition and memory normal.        Judgment: Judgment normal.     Comments: Insight intact     Lab Review:     Component Value Date/Time   NA 136 02/16/2019 0553   K 3.7 02/16/2019 0553   CL 104 02/16/2019 0553   CO2 22 02/16/2019 0553   GLUCOSE 128 (H) 02/16/2019 0553   BUN 12 02/16/2019 0553   CREATININE 0.69 02/16/2019 0553   CALCIUM  8.1 (L) 02/16/2019 0553   PROT 6.7 02/15/2019 1251   ALBUMIN 3.6 02/15/2019 1251   AST 42 (H) 02/15/2019 1251   ALT 41 02/15/2019 1251   ALKPHOS 63 02/15/2019 1251   BILITOT 0.9 02/15/2019 1251   GFRNONAA >60 02/16/2019 0553   GFRAA >60 02/16/2019 0553       Component Value Date/Time   WBC 14.0 (H) 02/16/2019 0553   RBC 3.90 02/16/2019 0553   HGB 11.9 (L) 02/16/2019 0553   HCT 37.1 02/16/2019 0553   PLT 227 02/16/2019 0553   MCV 95.1 02/16/2019 0553   MCH 30.5 02/16/2019 0553   MCHC 32.1 02/16/2019 0553   RDW 12.6 02/16/2019 0553   LYMPHSABS 1.6 03/12/2011 1154   MONOABS 0.6 03/12/2011 1154   EOSABS 0.1 03/12/2011 1154  BASOSABS 0.0 03/12/2011 1154    No results found for: POCLITH, LITHIUM   No results found for: PHENYTOIN, PHENOBARB,  VALPROATE, CBMZ   .res Assessment: Plan:   Plan:  Pramipexole  0.25 daily for TD symptoms. Prozac  20mg  BID Buspar  15mg  BID Xanax  0.5mg  BID   RTC 4 weeks  25 minutes spent dedicated to the care of this patient on the date of this encounter to include pre-visit review of records, ordering of medication, post visit documentation, and face-to-face time with the patient discussing anxiety, depression, OCD and panic attacks. Discussed continuing current medication regimen.   Patient advised to contact office with any questions, adverse effects, or acute worsening in signs and symptoms.  Discussed potential benefits, risk, and side effects of benzodiazepines to include potential risk of tolerance and dependence, as well as possible drowsiness. Advised patient not to drive if experiencing drowsiness and to take lowest possible effective dose to minimize risk of dependence and tolerance.  There are no diagnoses linked to this encounter.  Please see After Visit Summary for patient specific instructions.  Future Appointments  Date Time Provider Department Center  04/28/2024  1:00 PM Kimani Hovis Nattalie, NP [facility] None    No orders of the defined types were placed in this encounter.     -------------------------------

## 2024-04-28 NOTE — Progress Notes (Signed)
 [facility]

## 2024-05-26 ENCOUNTER — Telehealth: Admitting: Adult Health

## 2024-06-02 ENCOUNTER — Ambulatory Visit (INDEPENDENT_AMBULATORY_CARE_PROVIDER_SITE_OTHER): Admitting: Adult Health

## 2024-06-02 ENCOUNTER — Encounter: Payer: Self-pay | Admitting: Adult Health

## 2024-06-02 DIAGNOSIS — F411 Generalized anxiety disorder: Secondary | ICD-10-CM | POA: Diagnosis not present

## 2024-06-02 DIAGNOSIS — G2401 Drug induced subacute dyskinesia: Secondary | ICD-10-CM

## 2024-06-02 DIAGNOSIS — F429 Obsessive-compulsive disorder, unspecified: Secondary | ICD-10-CM

## 2024-06-02 DIAGNOSIS — F331 Major depressive disorder, recurrent, moderate: Secondary | ICD-10-CM | POA: Diagnosis not present

## 2024-06-02 DIAGNOSIS — F410 Panic disorder [episodic paroxysmal anxiety] without agoraphobia: Secondary | ICD-10-CM | POA: Diagnosis not present

## 2024-06-02 NOTE — Progress Notes (Signed)
 Kayla Beard [id number] 1945/09/12 78 y.o.  Subjective:   Patient ID:  Kayla Beard is a 78 y.o. (DOB 26-Dec-1945) female.  Chief Complaint: No chief complaint on file.   HPI Kayla Beard presents to the office today for follow-up of anxiety, depression, OCD, TD and panic attacks.  Describes mood today as ok. Pleasant. Reports tearfulness at times. Mood symptoms - reports depression at times, little spells of it. Reports stable interest and motivation. Reports feeling less anxious. Reports decreased irritability - I don't get real irritable. Denies recent panic attacks. Reports some worry, rumination and over thinking. Reports obsessive thoughts and acts - checking behaviors. Reports decreased issues with TD symptoms with starting the Mirapex . Reports mood is stable. Stating I feel like I'm doing ok right now. Feels like medications are helpful. Taking medications as prescribed. Energy levels have improved. Active, does not have a regular exercise routine with current physical disabilities - ambulates with rolling walker.  Reports she enjoys some usual interests and activities. Married. Lives with husband. Has 2 grown sons - grandchildren. Spending time with family and friends. Talking to a friend daily. Attends church.  Appetite adequate. Weight loss - 124 from 136 pounds.  Reports sleeping well most nights. Averages 10 to 12 hours. Reports some daytime napping. Reports improved focus and concentration. Reports attends Bible study. Talking on the phone with her best friend every day. Completing tasks. Managing minimal aspects of household. Retired - disabled.  Denies SI or HI.  Denies AH or VH. Denies self harm. Denies substance use.   Review of Systems:  Review of Systems  Musculoskeletal:  Negative for gait problem.  Neurological:  Negative for tremors.  Psychiatric/Behavioral:         Please refer to HPI    Medications: I have reviewed the patient's current  medications.  Current Outpatient Medications  Medication Sig Dispense Refill   acetaminophen  (TYLENOL ) 325 MG tablet Take 2 tablets (650 mg total) by mouth every 6 (six) hours as needed for mild pain or moderate pain. 30 tablet 3   alendronate (FOSAMAX) 70 MG tablet Take 70 mg by mouth every morning.     ALPRAZolam  (XANAX ) 0.5 MG tablet Take 1 tablet (0.5 mg total) by mouth 2 (two) times daily as needed for anxiety. 60 tablet 2   busPIRone  (BUSPAR ) 15 MG tablet Take 1 tablet (15 mg total) by mouth 2 (two) times daily. 60 tablet 2   diclofenac (VOLTAREN) 75 MG EC tablet Take 75 mg by mouth 2 (two) times daily.     ezetimibe (ZETIA) 10 MG tablet Take 10 mg by mouth daily.     FLUoxetine  (PROZAC ) 20 MG capsule Take 1 capsule (20 mg total) by mouth 2 (two) times daily. 180 capsule 3   gabapentin (NEURONTIN) 300 MG capsule Take by mouth.     ipratropium (ATROVENT) 0.03 % nasal spray SPRAY 2 SPRAYS INTO EACH NOSTRIL TWICE A DAY     lansoprazole  (PREVACID  SOLUTAB) 30 MG disintegrating tablet Take 1 tablet (30 mg total) by mouth daily. 21 tablet 1   metoprolol  (TOPROL -XL) 50 MG 24 hr tablet Take 50 mg by mouth daily.       metoprolol  tartrate (LOPRESSOR ) 25 MG tablet TAKE 0.5 TABLETS (12.5 MG TOTAL) BY MOUTH 2 TIMES DAILY.     pantoprazole (PROTONIX) 40 MG tablet TAKE 1 TABLET BY MOUTH EVERYDAY AT BEDTIME     pramipexole  (MIRAPEX ) 0.25 MG tablet Take 1 tablet (0.25 mg total) by mouth daily. 30 tablet 2  predniSONE (DELTASONE) 5 MG tablet TAKE 1 TABLET 3 TIMES A DAY X 5 DAYS THEN TWICE DAILY X 5 DAYS THEN 1 TAB EVERY DAY     primidone (MYSOLINE) 50 MG tablet Take 50 mg by mouth 2 (two) times daily.     rosuvastatin  (CRESTOR ) 10 MG tablet Take 10 mg by mouth daily.     SYMBICORT 160-4.5 MCG/ACT inhaler USE 2 INHALATIONS TWICE A DAY     No current facility-administered medications for this visit.    Medication Side Effects: None  Allergies:  Allergies  Allergen Reactions   Hydroxyzine     Other  reaction(s): Other (See Comments) Sleepiness Nightmares    Prednisone Anxiety    Other reaction(s): Other (See Comments)   Rivaroxaban Other (See Comments)    Bleeding Bleeding    Clindamycin Diarrhea   Dicyclomine     Other reaction(s): Other (See Comments) Patient doesn't recall side effect   Doxepin    Duloxetine     Other reaction(s): Other (See Comments) Patient doesn't recall side effect   Erythromycin Base Diarrhea   Escitalopram     Other reaction(s): Other (See Comments) Intensifies ocd   Flecainide     Other reaction(s): Other (See Comments) weakness   Hydrocodone Bit-Homatrop Mbr Other (See Comments)    sleepy   Lurasidone     Other reaction(s): Other (See Comments) diskinesia   Paroxetine Hcl     Other reaction(s): Other (See Comments) Patient doesn't recall side effect   Tizanidine     Other reaction(s): Other (See Comments) Patient doesn't recall side effect   Amoxicillin Rash   Levofloxacin Diarrhea   Metformin And Related Diarrhea    Past Medical History:  Diagnosis Date   Anxiety    Asthma    Depression    GERD (gastroesophageal reflux disease)    Hypercholesterolemia    Hypertension    Obsessive compulsive disorder     Past Medical History, Surgical history, Social history, and Family history were reviewed and updated as appropriate.   Please see review of systems for further details on the patient's review from today.   Objective:   Physical Exam:  There were no vitals taken for this visit.  Physical Exam Constitutional:      General: She is not in acute distress. Musculoskeletal:        General: No deformity.  Neurological:     Mental Status: She is alert and oriented to person, place, and time.     Coordination: Coordination normal.  Psychiatric:        Attention and Perception: Attention and perception normal. She does not perceive auditory or visual hallucinations.        Mood and Affect: Mood normal. Mood is not anxious or  depressed. Affect is not labile, blunt, angry or inappropriate.        Speech: Speech normal.        Behavior: Behavior normal.        Thought Content: Thought content normal. Thought content is not paranoid or delusional. Thought content does not include homicidal or suicidal ideation. Thought content does not include homicidal or suicidal plan.        Cognition and Memory: Cognition and memory normal.        Judgment: Judgment normal.     Comments: Insight intact     Lab Review:     Component Value Date/Time   NA 136 02/16/2019 0553   K 3.7 02/16/2019 0553   CL 104 02/16/2019  0553   CO2 22 02/16/2019 0553   GLUCOSE 128 (H) 02/16/2019 0553   BUN 12 02/16/2019 0553   CREATININE 0.69 02/16/2019 0553   CALCIUM  8.1 (L) 02/16/2019 0553   PROT 6.7 02/15/2019 1251   ALBUMIN 3.6 02/15/2019 1251   AST 42 (H) 02/15/2019 1251   ALT 41 02/15/2019 1251   ALKPHOS 63 02/15/2019 1251   BILITOT 0.9 02/15/2019 1251   GFRNONAA >60 02/16/2019 0553   GFRAA >60 02/16/2019 0553       Component Value Date/Time   WBC 14.0 (H) 02/16/2019 0553   RBC 3.90 02/16/2019 0553   HGB 11.9 (L) 02/16/2019 0553   HCT 37.1 02/16/2019 0553   PLT 227 02/16/2019 0553   MCV 95.1 02/16/2019 0553   MCH 30.5 02/16/2019 0553   MCHC 32.1 02/16/2019 0553   RDW 12.6 02/16/2019 0553   LYMPHSABS 1.6 03/12/2011 1154   MONOABS 0.6 03/12/2011 1154   EOSABS 0.1 03/12/2011 1154   BASOSABS 0.0 03/12/2011 1154    No results found for: POCLITH, LITHIUM   No results found for: PHENYTOIN, PHENOBARB, VALPROATE, CBMZ   .res Assessment: Plan:    Plan:  Pramipexole  0.25 daily for TD symptoms. Prozac  20mg  BID Buspar  15mg  BID Xanax  0.5mg  BID   RTC 3 months  25 minutes spent dedicated to the care of this patient on the date of this encounter to include pre-visit review of records, ordering of medication, post visit documentation, and face-to-face time with the patient discussing anxiety, depression, OCD and  panic attacks. Discussed continuing current medication regimen.   Patient advised to contact office with any questions, adverse effects, or acute worsening in signs and symptoms.  Discussed potential benefits, risk, and side effects of benzodiazepines to include potential risk of tolerance and dependence, as well as possible drowsiness. Advised patient not to drive if experiencing drowsiness and to take lowest possible effective dose to minimize risk of dependence and tolerance.  There are no diagnoses linked to this encounter.   Please see After Visit Summary for patient specific instructions.  No future appointments.  No orders of the defined types were placed in this encounter.   -------------------------------

## 2024-06-13 ENCOUNTER — Telehealth: Payer: Self-pay

## 2024-06-13 NOTE — Telephone Encounter (Signed)
 Patient wanted to let you know that she saw a neurologist and was diagnosed with early Alzheimer's and started on donepezil 5 mg. She reports having a little diarrhea, but said she has this occasionally. If it continues she will stop it and let us  know.   She also reports having some low BP on metoprolol  and I told her she should discuss with her prescribing physician.

## 2024-06-22 ENCOUNTER — Telehealth: Payer: Self-pay

## 2024-06-22 NOTE — Telephone Encounter (Signed)
 Pt called to report she was cleaning out her medication drawer and came across some information provided by the pharmacy on Buspar . She read it could cause TD. She knows she has been on this long-term but wanted to ask if this was potentially using her TD sx, which are currently controlled with pramipexole .

## 2024-06-23 NOTE — Telephone Encounter (Signed)
 Patient called back and was told that Buspar  was not likely the cause of her TD sx, thought to be Latuda.

## 2024-06-23 NOTE — Telephone Encounter (Signed)
 LVM to [facility]

## 2024-06-25 ENCOUNTER — Other Ambulatory Visit: Payer: Self-pay | Admitting: Adult Health

## 2024-06-25 DIAGNOSIS — F411 Generalized anxiety disorder: Secondary | ICD-10-CM

## 2024-06-25 DIAGNOSIS — F410 Panic disorder [episodic paroxysmal anxiety] without agoraphobia: Secondary | ICD-10-CM

## 2024-08-03 ENCOUNTER — Other Ambulatory Visit: Payer: Self-pay | Admitting: Adult Health

## 2024-08-03 DIAGNOSIS — G2401 Drug induced subacute dyskinesia: Secondary | ICD-10-CM

## 2024-08-16 ENCOUNTER — Telehealth: Payer: Self-pay | Admitting: Adult Health

## 2024-08-16 NOTE — Telephone Encounter (Signed)
 Rx for a 90-day supply was sent on 12/18. Pt notified.

## 2024-08-16 NOTE — Telephone Encounter (Signed)
 Pt needs rf of Pramipexole     [facility]

## 2024-09-04 ENCOUNTER — Encounter: Payer: Self-pay | Admitting: Adult Health

## 2024-09-04 ENCOUNTER — Ambulatory Visit: Admitting: Adult Health

## 2024-09-04 DIAGNOSIS — F410 Panic disorder [episodic paroxysmal anxiety] without agoraphobia: Secondary | ICD-10-CM | POA: Diagnosis not present

## 2024-09-04 DIAGNOSIS — G2401 Drug induced subacute dyskinesia: Secondary | ICD-10-CM

## 2024-09-04 DIAGNOSIS — F429 Obsessive-compulsive disorder, unspecified: Secondary | ICD-10-CM

## 2024-09-04 DIAGNOSIS — F419 Anxiety disorder, unspecified: Secondary | ICD-10-CM | POA: Diagnosis not present

## 2024-09-04 DIAGNOSIS — F411 Generalized anxiety disorder: Secondary | ICD-10-CM

## 2024-09-04 DIAGNOSIS — F329 Major depressive disorder, single episode, unspecified: Secondary | ICD-10-CM

## 2024-09-04 DIAGNOSIS — F331 Major depressive disorder, recurrent, moderate: Secondary | ICD-10-CM

## 2024-09-04 MED ORDER — BUSPIRONE HCL 15 MG PO TABS: 15.0000 mg | ORAL_TABLET | Freq: Two times a day (BID) | ORAL | 0 refills | Status: AC

## 2024-09-04 MED ORDER — FLUOXETINE HCL 20 MG PO CAPS: 20.0000 mg | ORAL_CAPSULE | Freq: Two times a day (BID) | ORAL | 0 refills | Status: AC

## 2024-09-04 MED ORDER — PRAMIPEXOLE DIHYDROCHLORIDE 0.25 MG PO TABS: 0.2500 mg | ORAL_TABLET | Freq: Every day | ORAL | 0 refills | Status: AC

## 2024-09-04 MED ORDER — ALPRAZOLAM 0.5 MG PO TABS: 0.5000 mg | ORAL_TABLET | Freq: Two times a day (BID) | ORAL | 2 refills | Status: AC | PRN

## 2024-09-04 NOTE — Progress Notes (Signed)
 Kayla Beard [id number] 08/01/1946 79 y.o.  Virtual Visit via Telephone Note  I connected with pt on 09/04/24 at 12:00 PM EST by telephone and verified that I am speaking with the correct person using two identifiers.   I discussed the limitations, risks, security and privacy concerns of performing an evaluation and management service by telephone and the availability of in person appointments. I also discussed with the patient that there may be a patient responsible charge related to this service. The patient expressed understanding and agreed to proceed.   I discussed the assessment and treatment plan with the patient. The patient was provided an opportunity to ask questions and all were answered. The patient agreed with the plan and demonstrated an understanding of the instructions.   The patient was advised to call back or seek an in-person evaluation if the symptoms worsen or if the condition fails to improve as anticipated.  I provided 25 minutes of non-face-to-face time during this encounter.  The patient was located at home.  The provider was located at [facility].   Angeline LOISE Sayers, NP   Subjective:   Patient ID:  Kayla Beard is a 79 y.o. (DOB 1946/06/02) female.  Chief Complaint: No chief complaint on file.   HPI Kayla Beard presents for follow-up of anxiety, depression, OCD, TD and panic attacks.  Describes mood today as ok. Pleasant. Reports tearfulness at times. Mood symptoms - reports depression sometimes. Reports stable interest and motivation. Reports feeling less anxious I do feel a little anxious, not severe, and it comes and goes. Reports decreased irritability - sometimes. Denies recent panic attacks. Reports some worry, rumination and over thinking. Reports obsessive thoughts and acts - more checking behaviors. Reports decreased issues with TD symptoms with Mirapex . Reports mood is stable. Stating I feel like I'm doing ok. Feels like medications  are helpful. Taking medications as prescribed. Energy levels vary - it's better some days than others. Active, does not have a regular exercise routine with current physical disabilities - ambulates with rolling walker.  Reports she enjoys some usual interests and activities. Married. Lives with husband - Kayla Beard. Has 2 grown sons - grandchildren. Spending time with family and friends. Attends church.  Appetite adequate. Weight loss - 123 pounds.  Reports sleeping well most nights. Averages 6 to 8 hours. Reports some daytime napping. Reports varying focus and concentration. Talking on the phone with her best friend every day. Completing tasks. Managing minimal aspects of household. Retired - disabled.  Denies SI or HI.  Denies AH or VH. Denies self harm. Denies substance use.   Review of Systems:  Review of Systems  Musculoskeletal:  Negative for gait problem.  Neurological:  Negative for tremors.  Psychiatric/Behavioral:         Please refer to HPI    Medications: I have reviewed the patient's current medications.  Current Outpatient Medications  Medication Sig Dispense Refill   acetaminophen  (TYLENOL ) 325 MG tablet Take 2 tablets (650 mg total) by mouth every 6 (six) hours as needed for mild pain or moderate pain. 30 tablet 3   alendronate (FOSAMAX) 70 MG tablet Take 70 mg by mouth every morning.     ALPRAZolam  (XANAX ) 0.5 MG tablet Take 1 tablet (0.5 mg total) by mouth 2 (two) times daily as needed for anxiety. 60 tablet 2   busPIRone  (BUSPAR ) 15 MG tablet Take 1 tablet (15 mg total) by mouth 2 (two) times daily. 60 tablet 2   diclofenac (VOLTAREN) 75 MG EC tablet  Take 75 mg by mouth 2 (two) times daily.     ezetimibe (ZETIA) 10 MG tablet Take 10 mg by mouth daily.     FLUoxetine  (PROZAC ) 20 MG capsule Take 1 capsule (20 mg total) by mouth 2 (two) times daily. 180 capsule 3   gabapentin (NEURONTIN) 300 MG capsule Take by mouth.     ipratropium (ATROVENT) 0.03 % nasal spray SPRAY  2 SPRAYS INTO EACH NOSTRIL TWICE A DAY     lansoprazole  (PREVACID  SOLUTAB) 30 MG disintegrating tablet Take 1 tablet (30 mg total) by mouth daily. 21 tablet 1   metoprolol  (TOPROL -XL) 50 MG 24 hr tablet Take 50 mg by mouth daily.       metoprolol  tartrate (LOPRESSOR ) 25 MG tablet TAKE 0.5 TABLETS (12.5 MG TOTAL) BY MOUTH 2 TIMES DAILY.     pantoprazole (PROTONIX) 40 MG tablet TAKE 1 TABLET BY MOUTH EVERYDAY AT BEDTIME     pramipexole  (MIRAPEX ) 0.25 MG tablet TAKE 1 TABLET BY MOUTH EVERY DAY 90 tablet 0   predniSONE (DELTASONE) 5 MG tablet TAKE 1 TABLET 3 TIMES A DAY X 5 DAYS THEN TWICE DAILY X 5 DAYS THEN 1 TAB EVERY DAY     primidone (MYSOLINE) 50 MG tablet Take 50 mg by mouth 2 (two) times daily.     rosuvastatin  (CRESTOR ) 10 MG tablet Take 10 mg by mouth daily.     SYMBICORT 160-4.5 MCG/ACT inhaler USE 2 INHALATIONS TWICE A DAY     No current facility-administered medications for this visit.    Medication Side Effects: None  Allergies: Allergies[1]  Past Medical History:  Diagnosis Date   Anxiety    Asthma    Depression    GERD (gastroesophageal reflux disease)    Hypercholesterolemia    Hypertension    Obsessive compulsive disorder     Family History  Family history unknown: Yes    Social History   Socioeconomic History   Marital status: Married    Spouse name: Not on file   Number of children: Not on file   Years of education: Not on file   Highest education level: Not on file  Occupational History   Not on file  Tobacco Use   Smoking status: Never   Smokeless tobacco: Never  Substance and Sexual Activity   Alcohol use: No   Drug use: No   Sexual activity: Not on file  Other Topics Concern   Not on file  Social History Narrative   Not on file   Social Drivers of Health   Tobacco Use: Medium Risk (06/30/2024)   Received from [facility]   Patient History    Smoking Tobacco Use: Former    Smokeless Tobacco Use: Never    Passive Exposure: Not on [employer], [employer] Strain: Not on file  Food Insecurity: Low Risk (05/26/2024)   Received from [facility]   Epic    Within the past 12 months, the food you bought just didn't last and you didn't have money to get more. : Never true    Within the past 12 months, you worried that your food would run out before you got money to buy more: Never true  Transportation Needs: No Transportation Needs (09/17/2023)   Received from [facility]    In the past 12 months, has lack of reliable transportation kept you from medical appointments, meetings, work or from getting things needed for daily living? : No  Physical Activity: Not on file  Stress: Not on file  Social Connections: Not on file  Intimate Partner Violence: Not on file  Depression (EYV7-0): Not on file  Alcohol Screen: Not on file  Housing: Low Risk (05/26/2024)   Received from [facility]   Epic    What is your living situation today?: I have a steady place to live    Think about the place you live. Do you have problems with any of the following? Choose all that apply:: None/None on this list  Utilities: Low Risk (09/17/2023)   Received from [facility]   Utilities    In the past 12 months has the electric, gas, oil, or water company threatened to shut off services in your home? : No  Health Literacy: Not on file    Past Medical History, Surgical history, Social history, and Family history were reviewed and updated as appropriate.   Please see review of systems for further details on the patient's review from today.   Objective:   Physical Exam:  There were no vitals taken for this visit.  Physical Exam Constitutional:      General: She is not in acute distress. Musculoskeletal:        General: No deformity.  Neurological:     Mental Status: She is alert and oriented to person, place, and time.     Coordination: Coordination normal.  Psychiatric:        Attention and Perception: Attention and  perception normal. She does not perceive auditory or visual hallucinations.        Mood and Affect: Mood normal. Mood is not anxious or depressed. Affect is not labile, blunt, angry or inappropriate.        Speech: Speech normal.        Behavior: Behavior normal.        Thought Content: Thought content normal. Thought content is not paranoid or delusional. Thought content does not include homicidal or suicidal ideation. Thought content does not include homicidal or suicidal plan.        Cognition and Memory: Cognition and memory normal.        Judgment: Judgment normal.     Comments: Insight intact     Lab Review:     Component Value Date/Time   NA 136 02/16/2019 0553   K 3.7 02/16/2019 0553   CL 104 02/16/2019 0553   CO2 22 02/16/2019 0553   GLUCOSE 128 (H) 02/16/2019 0553   BUN 12 02/16/2019 0553   CREATININE 0.69 02/16/2019 0553   CALCIUM  8.1 (L) 02/16/2019 0553   PROT 6.7 02/15/2019 1251   ALBUMIN 3.6 02/15/2019 1251   AST 42 (H) 02/15/2019 1251   ALT 41 02/15/2019 1251   ALKPHOS 63 02/15/2019 1251   BILITOT 0.9 02/15/2019 1251   GFRNONAA >60 02/16/2019 0553   GFRAA >60 02/16/2019 0553       Component Value Date/Time   WBC 14.0 (H) 02/16/2019 0553   RBC 3.90 02/16/2019 0553   HGB 11.9 (L) 02/16/2019 0553   HCT 37.1 02/16/2019 0553   PLT 227 02/16/2019 0553   MCV 95.1 02/16/2019 0553   MCH 30.5 02/16/2019 0553   MCHC 32.1 02/16/2019 0553   RDW 12.6 02/16/2019 0553   LYMPHSABS 1.6 03/12/2011 1154   MONOABS 0.6 03/12/2011 1154   EOSABS 0.1 03/12/2011 1154   BASOSABS 0.0 03/12/2011 1154    No results found for: POCLITH, LITHIUM   No results found for: PHENYTOIN, PHENOBARB, VALPROATE, CBMZ   .res Assessment: Plan:  Plan:  Pramipexole  0.25 daily for TD symptoms. Prozac  20mg  BID Buspar  15mg  BID Xanax  0.5mg  BID   RTC 3 months  25 minutes spent dedicated to the care of this patient on the date of this encounter to include pre-visit review of  records, ordering of medication, post visit documentation, and face-to-face time with the patient discussing anxiety, depression, OCD and panic attacks. Discussed continuing current medication regimen.   Patient advised to contact office with any questions, adverse effects, or acute worsening in signs and symptoms.  Discussed potential benefits, risk, and side effects of benzodiazepines to include potential risk of tolerance and dependence, as well as possible drowsiness. Advised patient not to drive if experiencing drowsiness and to take lowest possible effective dose to minimize risk of dependence and tolerance.  There are no diagnoses linked to this encounter.  Please see After Visit Summary for patient specific instructions.  Future Appointments  Date Time Provider Department Center  09/04/2024 12:00 PM Benino Korinek Nattalie, NP [facility] None    No orders of the defined types were placed in this encounter.     -------------------------------    [1]  Allergies Allergen Reactions   Hydroxyzine     Other reaction(s): Other (See Comments) Sleepiness Nightmares    Prednisone Anxiety    Other reaction(s): Other (See Comments)   Rivaroxaban Other (See Comments)    Bleeding Bleeding    Clindamycin Diarrhea   Dicyclomine     Other reaction(s): Other (See Comments) Patient doesn't recall side effect   Doxepin    Duloxetine     Other reaction(s): Other (See Comments) Patient doesn't recall side effect   Erythromycin Base Diarrhea   Escitalopram     Other reaction(s): Other (See Comments) Intensifies ocd   Flecainide     Other reaction(s): Other (See Comments) weakness   Hydrocodone Bit-Homatrop Mbr Other (See Comments)    sleepy   Lurasidone     Other reaction(s): Other (See Comments) diskinesia   Paroxetine Hcl     Other reaction(s): Other (See Comments) Patient doesn't recall side effect   Tizanidine     Other reaction(s): Other (See Comments) Patient doesn't  recall side effect   Amoxicillin Rash   Levofloxacin Diarrhea   Metformin And Related Diarrhea

## 2024-12-05 ENCOUNTER — Telehealth: Admitting: Adult Health
